# Patient Record
Sex: Female | Born: 1970 | Race: White | Hispanic: No | Marital: Married | Smoking: Never smoker
Health system: Southern US, Community
[De-identification: ages and names within clinical notes are randomized; demographics above are authoritative.]

## PROBLEM LIST (undated history)

## (undated) DIAGNOSIS — I456 Pre-excitation syndrome: Secondary | ICD-10-CM

## (undated) DIAGNOSIS — I341 Nonrheumatic mitral (valve) prolapse: Secondary | ICD-10-CM

## (undated) HISTORY — PX: OTHER SURGICAL HISTORY: SHX169

## (undated) HISTORY — DX: Pre-excitation syndrome: I45.6

## (undated) HISTORY — DX: Nonrheumatic mitral (valve) prolapse: I34.1

---

## 1998-01-14 ENCOUNTER — Other Ambulatory Visit: Admission: RE | Admit: 1998-01-14 | Discharge: 1998-01-14 | Payer: Self-pay | Admitting: *Deleted

## 1999-06-17 ENCOUNTER — Other Ambulatory Visit: Admission: RE | Admit: 1999-06-17 | Discharge: 1999-06-17 | Payer: Self-pay | Admitting: *Deleted

## 2000-01-06 ENCOUNTER — Inpatient Hospital Stay (HOSPITAL_COMMUNITY): Admission: AD | Admit: 2000-01-06 | Discharge: 2000-01-09 | Payer: Self-pay | Admitting: *Deleted

## 2000-01-06 ENCOUNTER — Encounter (INDEPENDENT_AMBULATORY_CARE_PROVIDER_SITE_OTHER): Payer: Self-pay

## 2000-02-21 ENCOUNTER — Other Ambulatory Visit: Admission: RE | Admit: 2000-02-21 | Discharge: 2000-02-21 | Payer: Self-pay | Admitting: *Deleted

## 2001-02-19 ENCOUNTER — Other Ambulatory Visit: Admission: RE | Admit: 2001-02-19 | Discharge: 2001-02-19 | Payer: Self-pay | Admitting: *Deleted

## 2002-04-03 ENCOUNTER — Other Ambulatory Visit: Admission: RE | Admit: 2002-04-03 | Discharge: 2002-04-03 | Payer: Self-pay | Admitting: *Deleted

## 2003-04-29 ENCOUNTER — Other Ambulatory Visit: Admission: RE | Admit: 2003-04-29 | Discharge: 2003-04-29 | Payer: Self-pay | Admitting: *Deleted

## 2003-07-16 ENCOUNTER — Emergency Department (HOSPITAL_COMMUNITY): Admission: EM | Admit: 2003-07-16 | Discharge: 2003-07-17 | Payer: Self-pay | Admitting: Emergency Medicine

## 2004-08-02 ENCOUNTER — Other Ambulatory Visit: Admission: RE | Admit: 2004-08-02 | Discharge: 2004-08-02 | Payer: Self-pay | Admitting: Obstetrics and Gynecology

## 2005-01-19 ENCOUNTER — Ambulatory Visit (HOSPITAL_COMMUNITY): Admission: RE | Admit: 2005-01-19 | Discharge: 2005-01-19 | Payer: Self-pay | Admitting: Obstetrics and Gynecology

## 2006-07-11 ENCOUNTER — Encounter: Admission: RE | Admit: 2006-07-11 | Discharge: 2006-07-11 | Payer: Self-pay | Admitting: Obstetrics and Gynecology

## 2006-07-11 ENCOUNTER — Encounter (INDEPENDENT_AMBULATORY_CARE_PROVIDER_SITE_OTHER): Payer: Self-pay | Admitting: Specialist

## 2006-12-29 ENCOUNTER — Encounter: Admission: RE | Admit: 2006-12-29 | Discharge: 2006-12-29 | Payer: Self-pay | Admitting: Obstetrics and Gynecology

## 2007-07-06 ENCOUNTER — Encounter: Admission: RE | Admit: 2007-07-06 | Discharge: 2007-07-06 | Payer: Self-pay | Admitting: Obstetrics and Gynecology

## 2007-12-28 ENCOUNTER — Encounter: Admission: RE | Admit: 2007-12-28 | Discharge: 2007-12-28 | Payer: Self-pay | Admitting: Obstetrics and Gynecology

## 2008-03-13 ENCOUNTER — Encounter: Admission: RE | Admit: 2008-03-13 | Discharge: 2008-03-13 | Payer: Self-pay | Admitting: Obstetrics and Gynecology

## 2010-05-14 ENCOUNTER — Encounter: Admission: RE | Admit: 2010-05-14 | Discharge: 2010-05-14 | Payer: Self-pay | Admitting: Obstetrics and Gynecology

## 2010-09-12 ENCOUNTER — Encounter: Payer: Self-pay | Admitting: Obstetrics and Gynecology

## 2011-01-07 NOTE — Op Note (Signed)
NAME:  Alison Robertson, MUN NO.:  [id number]   MEDICAL RECORD NO.:  [id number]          PATIENT TYPE:  AMB   LOCATION:  SDC                           FACILITY:  [facility]   PHYSICIAN:  Dineen Kid. Rana Snare, M.D.    DATE OF BIRTH:  05-11-1971   DATE OF PROCEDURE:  01/19/2005  DATE OF DISCHARGE:                                 OPERATIVE REPORT   PREOPERATIVE DIAGNOSIS:  Multiparity, desires sterility.   POSTOPERATIVE DIAGNOSIS:  Multiparity, desires sterility.   PROCEDURE:  Laparoscopic bilateral tubal ligation by fulguration.   SURGEON:  Dineen Kid. Rana Snare, M.D.   ANESTHESIA:  General endotracheal.   INDICATIONS:  [name] is a 40 year old G2, P2, currently with a Mirena IUD  and desires definitive surgical intervention and desires laparoscopic tubal  ligation.  The risks and benefits were discussed at length, including a five  out of 1000 failure rate.  She gives her informed consent and wishes to  proceed.   FINDINGS AT THE TIME OF SURGERY:  Normal-appearing intra-abdominal anatomy,  including normal liver, ovaries, tubes and uterus.   DESCRIPTION OF PROCEDURE:  After adequate analgesia, the patient was placed  in the dorsal lithotomy position.  She was sterilely prepped and draped.  The bladder was sterilely drained.  A tenaculum was placed on the anterior  lip of the cervix.  A 1 cm infraumbilical skin incision was made, a Veress  needle was inserted.  The abdomen was insufflated to dullness to percussion.  An 11 mm trocar was inserted and the laparoscope was inserted, and the above  findings were noted.  The right fallopian tube was identified by the  fimbriated end.  A midportion of tube was grasped with the bipolar cautery.  It was cauterized over an approximately 2-3 cm section of the tube with a  good thermal burn noted across the entirety of the tube and a loss of  resistance to the Ohm meter.  The left fallopian tube was identified by the  fimbriated end and the  midportion of the tube grasped, cauterized over a 2-3  cm section of the tube with loss of resistance by the Ohm meter and good  thermal burn noted across the entirety of the tube.  Hemostasis was achieved  at all levels.  The trocar was then removed, abdomen desufflated.  A 0  Vicryl suture was placed in an interrupted fashion in the fascia, a 3-0  Vicryl Rapide subcuticular suture was placed with good approximation and  good hemostasis.  Marcaine 0.25% 10 mL were used to infiltrate the incision.  The tenaculum was removed from the cervix, noted to be hemostatic.  The  patient was then transferred to the recovery room in stable condition.  Sponge, needle and instrument count was normal x3.  Estimated blood loss was  minimal.  The patient received 1 g of Rocephin preoperatively and 30 mg of  Toradol postoperatively.   DISPOSITION:  The patient will be discharged home and follow up in the  office in two to three weeks, sent home with an instruction sheet for  laparoscopy, also  a prescription for Vicodin #20, told to return for  increased pain, fever or bleeding.      DCL/MEDQ  D:  01/19/2005  T:  01/19/2005  Job:  [id number]

## 2011-01-07 NOTE — Op Note (Signed)
[facility] [place]  Patient:    Alison Robertson, Alison Robertson                       MRN: [id number] Proc. Date: 01/06/00 Adm. Date:  [id number] Attending:  Trevor Iha                           Operative Report  PREOPERATIVE DIAGNOSIS:       Intrauterine pregnancy at 41 weeks, previous cesarean section, and nonreassuring fetal heart rate.  POSTOPERATIVE DIAGNOSIS:      Intrauterine pregnancy at 41 weeks, previous cesarean section, and nonreassuring fetal heart rate.  OPERATION:                    Repeat low transverse cesarean section.  SURGEON:                      Trevor Iha, M.D.  ASSISTANT:  ANESTHESIA:                   Spinal.  ESTIMATED BLOOD LOSS:  INDICATIONS:                  Ms. Kuntzman is a 40 year old, gravida 2, para 1, who presented in labor at 41-1/2 weeks.  She is contracting every two to four minutes. Fetal heart rate showed several decellerations, both variable and late decellerations.  However, recovered quickly and continued to have good beat to beat.  After several hours of labor, the patient continued to have decellerations and they actually worsened, having repetitive lates at periods of time as well s occasional prolonged decellerations.  The cervix was 2 cm, 90%, and -2 with a previous cesarean section.  Because she was remote from delivery, previous cesarean section, and nonreassuring fetal heart rate, we proceeded with repeat cesarean section.  Risks and benefits were discussed.  Informed consent was obtained.  FINDINGS:                     A viable female infant, Apgars 8 and 9.  The arterial pH is currently pending.  DESCRIPTION OF PROCEDURE:     After adequate analgesia, the patient was placed n the supine position with a left lateral tilt.  She is sterilely prepped and draped. Foley catheter is sterilely placed.  A Pfannenstiel skin incision was made two fingerbreadths above the pubic symphysis, was taken down  sharply in the midline, incised transversely across the fascia which was extended superiorly and inferiorly off the bellies of the rectus muscle which were separated sharply in the midline. The peritoneum is then entered sharply.  The uterine serosal was elevated, nicked, and incised transversely, and the bladder flap was created and placed behind the bladder blade.  The low segment of the uterus was very thin.  Incision with scalpel was made to the infants vertex.  This was extended laterally with the operators  fingertips and clear fluid was noted.  The infants vertex was then delivered atraumatically.  The nares and pharynx were suctioned.  The infant was then delivered, cord clamped, and handed to the pediatrician.  Good cry was noted. Apgars were 8 and 9 of a female infant.  Cord bloods were obtained.  The placenta was extracted manually.  The uterus was exteriorized, wiped clean with a dry lap. he myotomy incision was closed with a single layer of 0 Monocryl in  a running locking fashion.  Two figure-of-eight sutures were used for hemostasis.  Normal uterus,  tubes, and ovaries were noted.  The uterus was placed back into the peritoneal cavity and after a copious amount of irrigation, adequate hemostasis was assured. The peritoneum was then closed with 0 Monocryl.  The rectus muscle was plicated in the midline.  Irrigation was applied and after adequate hemostasis, the fascia as closed with 0 Vicryl in a running fashion.  Irrigation was applied and after adequate hemostasis, the skin was stapled and Steri-Strips applied.  The patient tolerated the procedure well and was stable on transfer to the recovery room. he patient received 1 gram of cefotetan after delivery of the placenta.  Estimated  blood loss was 800 cc. DD:  01/06/00 TD:  01/08/00 Job: [id number]

## 2011-01-07 NOTE — H&P (Signed)
NAME:  Alison Robertson, Alison Robertson NO.:  [id number]   MEDICAL RECORD NO.:  [id number]          PATIENT TYPE:  AMB   LOCATION:  SDC                           FACILITY:  [facility]   PHYSICIAN:  Dineen Kid. Rana Snare, M.D.    DATE OF BIRTH:  Mar 18, 1971   DATE OF ADMISSION:  01/19/2005  DATE OF DISCHARGE:                                HISTORY & PHYSICAL   HISTORY OF PRESENT ILLNESS:  [name] is a 40 year old, G2, P2, who  presents for surgical sterilization.  Currently, she has a [place] IUD, and  is having menstrual cycles with the IUD.  She is not having any problems  with it, and it is due to be removed in September of 2007.  She does desire  definitive surgical sterilization, and presents for tubal ligation.   PAST MEDICAL HISTORY:  Negative.   PAST SURGICAL HISTORY:  She has had 2 cesarean sections.   MEDICATIONS:  She is currently on atenolol for mitral valve prolapse and  Wolff-Parkinson-White.   ALLERGIES:  She has no known drug allergies.   PHYSICAL EXAMINATION:  VITAL SIGNS:  Blood pressure 110/74, weight 179.  HEART:  Regular rate and rhythm.  LUNGS:  Clear to auscultation bilaterally.  ABDOMEN:  Nondistended, nontender.  PELVIC:  The uterus is anteverted, mobile, and nontender.  The IUD string is  in place.   IMPRESSION AND PLAN:  Multiparity and desires sterility.   PLAN:  Laparoscopic sterilization by fulguration.  Risks and benefits of the  procedure were discussed at length which include, but are not limited to,  risk of infection, bleeding, damage to the uterus, tubes, ovaries, bowel,  bladder, risk of failure of the tubal quoted at 12/998 failure.  She does  give her informed consent and wishes to proceed.      DCL/MEDQ  D:  01/18/2005  T:  01/18/2005  Job:  [id number]

## 2011-01-07 NOTE — Discharge Summary (Signed)
[facility] [place]  Patient:    Alison Robertson, Alison Robertson                       MRN: [id number] Adm. Date:  [id number] Disc. Date: [id number] Attending:  Trevor Iha Dictator:   Danie Chandler, R.N.                           Discharge Summary  ADMISSION DIAGNOSES:          1. Intrauterine pregnancy at [redacted] weeks gestation.                               2. Previous cesarean section.                               3. Nonreassuring fetal heart rate.  DISCHARGE DIAGNOSES:          1. Intrauterine pregnancy at [redacted] weeks gestation.                               2. Previous cesarean section.                               3. Nonreassuring fetal heart rate.  PROCEDURE:                    On Jan 06, 2000, repeat low transverse cesarean section.  REASON FOR ADMISSION:         The patient is a 40 year old, gravida 2, para 1, ho presented in labor at 41-1/[redacted] weeks gestation.  She was contracting every two to  four minutes.  The fetal heart rate showed several decellerations, both variable and late decellerations.  These recovered quickly and there continued to be good beat to beat variability.  After several hours of labor, the patient continued o have decellerations having repetitive lates at periods of time as well as occasional prolonged decellerations.  The cervix was 2 cm, 90%, and -2 with a previous cesarean section.  Because the patient was remote from delivery, had delivered by cesarean section previously, and a nonreassuring fetal heart rate, the decision was made to proceed with repeat cesarean section.  HOSPITAL COURSE:              The patient was taken to the operating room and underwent the above named procedure without complications.  This was productive of a viable female infant with Apgars of 8 at one minute and 9 at five minutes. Postoperatively, the patient did well.  On postoperative day #1, the patients hemoglobin was 9.0, hematocrit 25.3, and white  blood cell count 10.3.  On postoperative day #2, the patient was without complaints.  She had a good return of bowel function and was tolerating a regular diet.  She was also ambulating well  without difficulty and had good pain control.  She was discharged home on postoperative day #3.  CONDITION ON DISCHARGE:       Good.  DIET:                         Regular as tolerated.  ACTIVITY:  No heavy lifting, no driving, and no vaginal entry.  FOLLOW-UP:                    She is to follow up in the office in one to two weeks for incision check.  She is to call for a temperature greater than 100 degrees,  persistent nausea and vomiting, heavy vaginal bleeding, and/or redness or drainage from the incision site.  DISCHARGE MEDICATIONS:        1. Prenatal vitamins one p.o. q.d.                               2. Tylox as directed by M.D. DD:  01/19/00 TD:  01/19/00 Job: [id number]

## 2014-06-26 ENCOUNTER — Other Ambulatory Visit: Payer: Self-pay | Admitting: Obstetrics and Gynecology

## 2014-06-27 LAB — CYTOLOGY - PAP

## 2016-08-29 MED FILL — AZITHROMYCIN 250 MG TABLET: 250 | 5 days supply | Qty: 6 | Fill #0

## 2016-08-29 MED FILL — BENZONATATE 100 MG CAPSULE: 100 | 5 days supply | Qty: 15 | Fill #0

## 2016-08-29 MED FILL — VENTOLIN HFA 90 MCG INHALER: 108 (90 BAS | 25 days supply | Qty: 18 | Fill #0

## 2017-04-20 ENCOUNTER — Other Ambulatory Visit: Payer: Self-pay | Admitting: Obstetrics and Gynecology

## 2017-04-20 DIAGNOSIS — R928 Other abnormal and inconclusive findings on diagnostic imaging of breast: Secondary | ICD-10-CM

## 2017-04-28 ENCOUNTER — Ambulatory Visit
Admission: RE | Admit: 2017-04-28 | Discharge: 2017-04-28 | Disposition: A | Discharge status: Home / Self Care | Payer: BLUE CROSS/BLUE SHIELD | Source: Ambulatory Visit | Attending: Obstetrics and Gynecology | Admitting: Obstetrics and Gynecology

## 2017-04-28 DIAGNOSIS — R928 Other abnormal and inconclusive findings on diagnostic imaging of breast: Secondary | ICD-10-CM

## 2018-03-09 ENCOUNTER — Ambulatory Visit (HOSPITAL_COMMUNITY)
Admission: EM | Admit: 2018-03-09 | Discharge: 2018-03-09 | Disposition: A | Discharge status: Home / Self Care | Payer: BLUE CROSS/BLUE SHIELD | Attending: Internal Medicine | Admitting: Internal Medicine

## 2018-03-09 ENCOUNTER — Encounter (HOSPITAL_COMMUNITY): Payer: Self-pay

## 2018-03-09 DIAGNOSIS — Z23 Encounter for immunization: Secondary | ICD-10-CM | POA: Diagnosis not present

## 2018-03-09 DIAGNOSIS — S51811A Laceration without foreign body of right forearm, initial encounter: Secondary | ICD-10-CM | POA: Diagnosis not present

## 2018-03-09 DIAGNOSIS — W540XXA Bitten by dog, initial encounter: Secondary | ICD-10-CM | POA: Diagnosis not present

## 2018-03-09 MED ORDER — AMOXICILLIN-POT CLAVULANATE 875-125 MG PO TABS: 1.0000 | ORAL_TABLET | Freq: Two times a day (BID) | ORAL | 0 refills | Status: DC

## 2018-03-09 MED ORDER — MUPIROCIN 2 % EX OINT: 1.0000 "application " | TOPICAL_OINTMENT | Freq: Two times a day (BID) | CUTANEOUS | 0 refills | Status: DC

## 2018-03-09 MED ORDER — TETANUS-DIPHTH-ACELL PERTUSSIS 5-2.5-18.5 LF-MCG/0.5 IM SUSP
0.5000 mL | Freq: Once | INTRAMUSCULAR | Status: AC
  Administered 2018-03-09: 0.5 mL via INTRAMUSCULAR

## 2018-03-09 MED ORDER — TETANUS-DIPHTH-ACELL PERTUSSIS 5-2.5-18.5 LF-MCG/0.5 IM SUSP
INTRAMUSCULAR | Status: AC
  Filled 2018-03-09: qty 0.5

## 2018-03-09 NOTE — ED Provider Notes (Signed)
[facility]    CSN: [id number] Arrival date & time: 03/09/18  1921     History   Chief Complaint Chief Complaint  Patient presents with  . Animal Bite    HPI Sheylin MISTIE ADNEY is a 47 y.o. female.   48 year old female comes in for dog bite to the right arm.  States he was trying to feed it neighbor's dog today when she sustained the bite.  Bleeding controlled.  Denies numbness, tingling to the fingers.  Denies trouble moving the fingers.  Slight erythema to the wound.  Denies fever, chills, night sweats.  States neighbor is currently in the hospital, and neighbor's wife will be returning from out of the country tomorrow.  They think dog is updated on immunizations, but will double check tomorrow.  Unknown last tetanus.     History reviewed. No pertinent past medical history.  There are no active problems to display for this patient.   History reviewed. No pertinent surgical history.  OB History   None      Home Medications    Prior to Admission medications   Medication Sig Start Date End Date Taking? Authorizing Provider  amoxicillin-clavulanate (AUGMENTIN) 875-125 MG tablet Take 1 tablet by mouth every 12 (twelve) hours. 03/09/18   Cathie Hoops, Shakeya Kerkman V, PA-C  mupirocin ointment (BACTROBAN) 2 % Apply 1 application topically 2 (two) times daily. 03/09/18   Belinda Fisher, PA-C    Family History No family history on file.  Social History Social History   Tobacco Use  . Smoking status: Never Smoker  . Smokeless tobacco: Never Used  Substance Use Topics  . Alcohol use: Not on file  . Drug use: Not on file     Allergies   Patient has no known allergies.   Review of Systems Review of Systems  Reason unable to perform ROS: See HPI as above.     Physical Exam Triage Vital Signs ED Triage Vitals  Enc Vitals Group     BP 03/09/18 2002 130/76     Pulse Rate 03/09/18 2002 69     Resp 03/09/18 2002 18     Temp 03/09/18 2002 98.4 F (36.9 C)     Temp src --     SpO2 03/09/18 2002 100 %     Weight --      Height --      Head Circumference --      Peak Flow --      Pain Score 03/09/18 2003 5     Pain Loc --      Pain Edu? --      Excl. in GC? --    No data found.  Updated Vital Signs BP 130/76   Pulse 69   Temp 98.4 F (36.9 C)   Resp 18   SpO2 100%   Physical Exam  Constitutional: She is oriented to person, place, and time. She appears well-developed and well-nourished. No distress.  HENT:  Head: Normocephalic and atraumatic.  Eyes: Pupils are equal, round, and reactive to light. Conjunctivae are normal.  Musculoskeletal:  See picture below.  At least 3 cm laceration to the right forearm, and small puncture wound.  Bleeding controlled.  Mild tenderness to palpation around wound.  Otherwise full range of motion of elbow, wrist and fingers.  Strength normal and equal bilaterally.  Sensation intact ankle bilaterally.  Radial pulse 2+ and equal bilaterally.  Cap refill less than 2 seconds.  Neurological: She is alert and oriented to  person, place, and time.  Skin: Skin is warm and dry. She is not diaphoretic.         UC Treatments / Results  Labs (all labs ordered are listed, but only abnormal results are displayed) Labs Reviewed - No data to display  EKG None  Radiology No results found.  Procedures Procedures (including critical care time)  Medications Ordered in UC Medications  Tdap (BOOSTRIX) injection 0.5 mL (0.5 mLs Intramuscular Given 03/09/18 2110)    Initial Impression / Assessment and Plan / UC Course  I have reviewed the triage vital signs and the nursing notes.  Pertinent labs & imaging results that were available during my care of the patient were reviewed by me and considered in my medical decision making (see chart for details).    Discussed risks and benefits of laceration repair to the dog bite.  Patient opted to defer laceration repair.  Laceration cleaned extensively with pressure wash.  Cleaned  with Hibiclens.  Dressed with antibiotic ointment.  Tdap updated.  Patient start Augmentin as directed.  Wound care instructions given.  Return precautions given.  Patient expresses understanding and agrees to plan.  Final Clinical Impressions(s) / UC Diagnoses   Final diagnoses:  Dog bite, initial encounter    ED Prescriptions    Medication Sig Dispense Auth. Provider   amoxicillin-clavulanate (AUGMENTIN) 875-125 MG tablet Take 1 tablet by mouth every 12 (twelve) hours. 14 tablet Stephaie Dardis V, PA-C   mupirocin ointment (BACTROBAN) 2 % Apply 1 application topically 2 (two) times daily. 22 g Threasa AlphaYu, Kenndra Morris V, PA-C        Treshun Wold V, [place]PA-C 03/09/18 2114

## 2018-03-09 NOTE — ED Triage Notes (Signed)
Pt presents with complaints of dog bite from her neighbors dog.  Pt is pretty sure that the dog was vaccinated. She is taking care of neighbors dog and he is in the hospital. Aware we cannot do rabies vaccines here at this clinic. patient states they will find out later if it is needed.

## 2018-03-09 NOTE — Discharge Instructions (Signed)
Tetanus updated today.  Start Augmentin as directed.  As discussed, given its appointment, will not close the wound, as it can cause increased infection.  Wash area with soap and water, dressed with Bactroban ointment.  Monitor for spreading redness, increased warmth, purulent drainage, fever, increased pain, follow-up for reevaluation.  As discussed, please follow-up with neighbor for vaccinations of dog.  Please follow-up at the emergency department if dog is not up-to-date on rabies to start rabies vaccine.

## 2018-07-05 ENCOUNTER — Other Ambulatory Visit: Payer: Self-pay

## 2018-07-05 ENCOUNTER — Telehealth: Payer: Self-pay | Admitting: Cardiology

## 2018-07-05 MED ORDER — ATENOLOL 50 MG PO TABS: 50.0000 mg | ORAL_TABLET | Freq: Every day | ORAL | 0 refills | Status: DC

## 2018-07-05 NOTE — Telephone Encounter (Signed)
Gunnar Fusiaula can you tell me what pharmacy this is because I cannot find it?

## 2018-07-05 NOTE — Telephone Encounter (Signed)
Given to Revankar for review. 

## 2018-07-05 NOTE — Telephone Encounter (Signed)
° ° °  1. Which medications need to be refilled? (please list name of each medication and dose if known) Atenolol 50mg  tablet 1QD   2. Which pharmacy/location (including street and city if local pharmacy) is medication to be sent to?[facility] [phone]  3. Do they need a 30 day or 90 day supply? 90

## 2018-07-05 NOTE — Telephone Encounter (Signed)
Med refill has been sent. 

## 2018-10-05 ENCOUNTER — Other Ambulatory Visit: Payer: Self-pay | Admitting: Cardiology

## 2018-10-05 ENCOUNTER — Other Ambulatory Visit: Payer: Self-pay

## 2018-10-05 NOTE — Telephone Encounter (Signed)
6 month refill approval for atenolol sent to patients pharmacy.

## 2018-10-29 IMAGING — US ULTRASOUND LEFT BREAST LIMITED
1 series · 5 of 5 positions shown · non-contrast
Comparison: April 18, 2017 and earlier priors

CLINICAL DATA: Possible mass identified in the deep outer left
breast on recent screening mammogram.

EXAM:
2D DIGITAL DIAGNOSTIC LEFT MAMMOGRAM WITH ADJUNCT TOMO
ULTRASOUND LEFT BREAST

[Series 1: ultrasound left breast limited · 0.06mm/px · 5 of 5 slices shown]
[im 1/5]
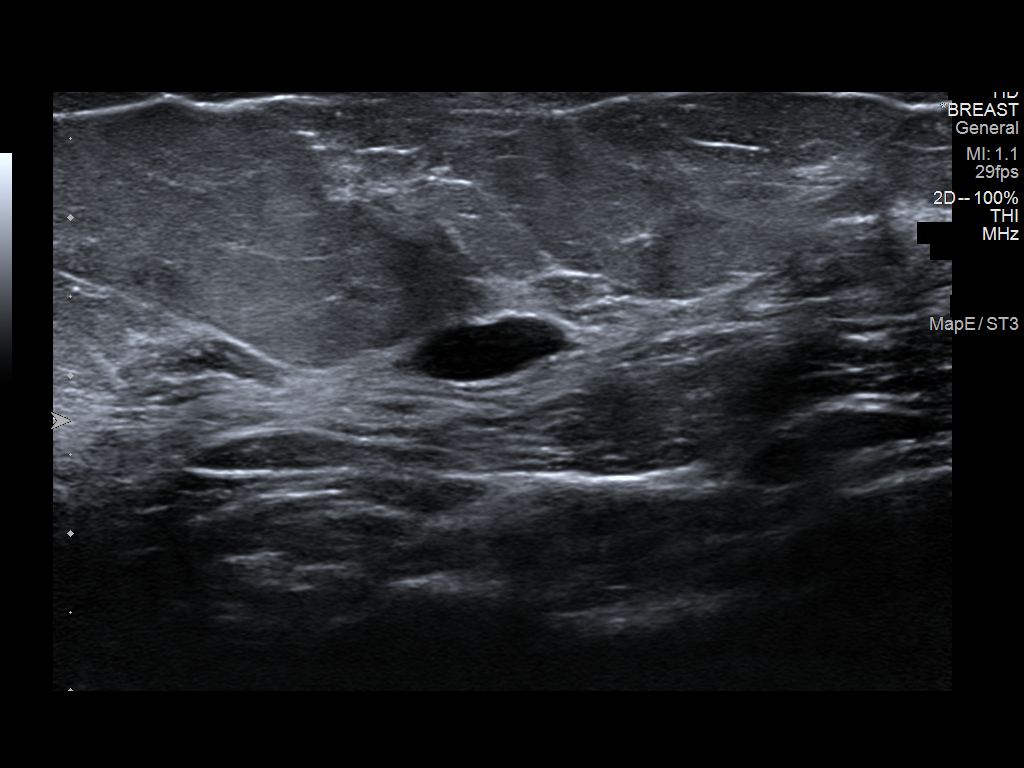
[im 2/5]
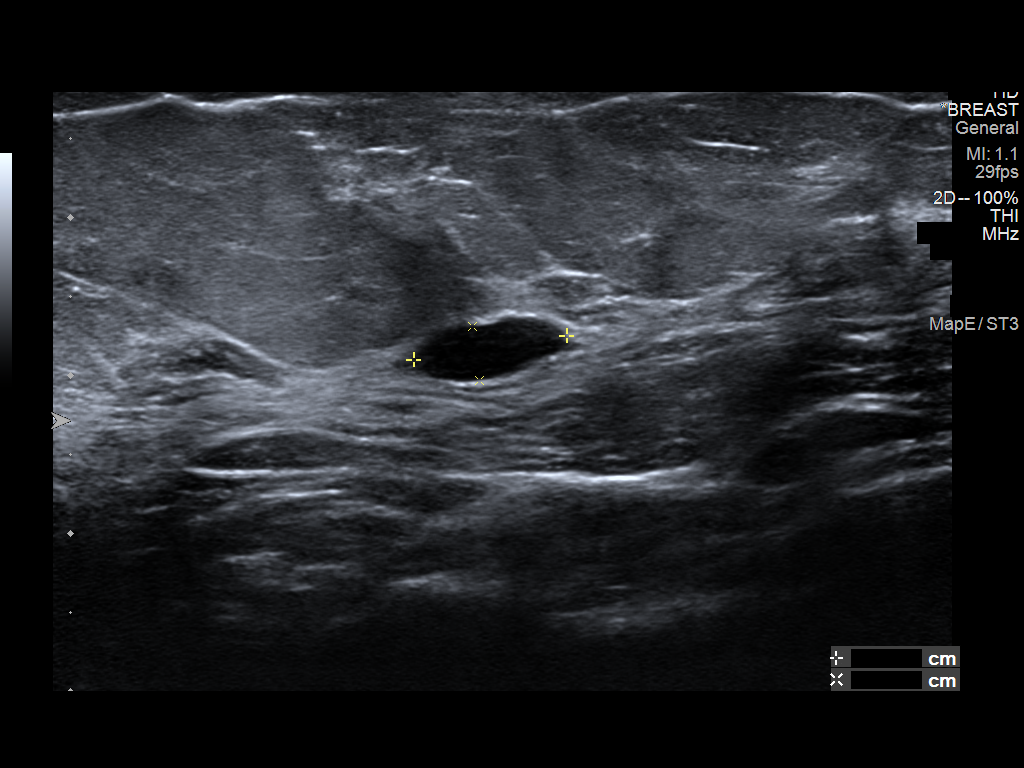
[im 3/5]
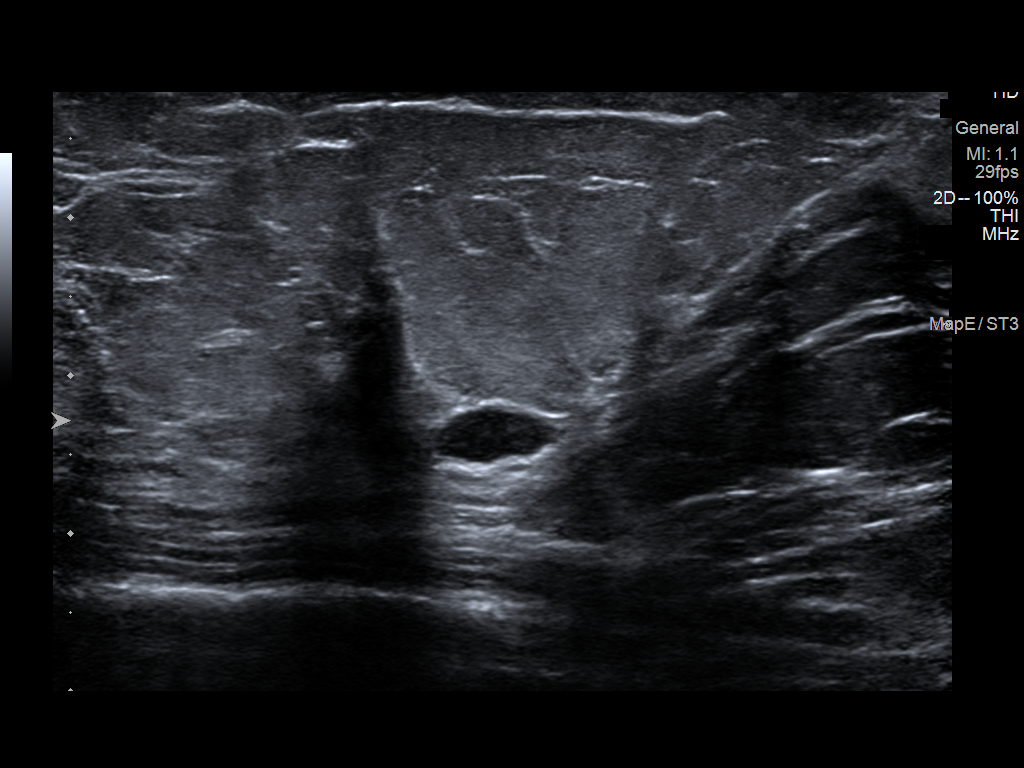
[im 4/5]
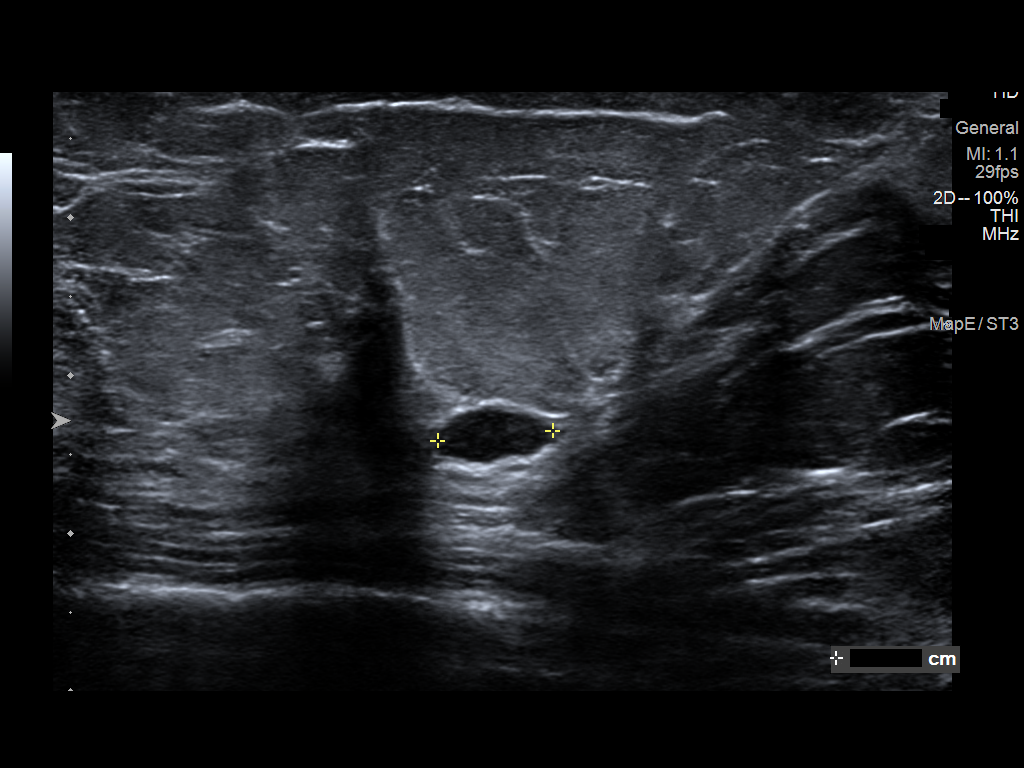
[im 5/5]
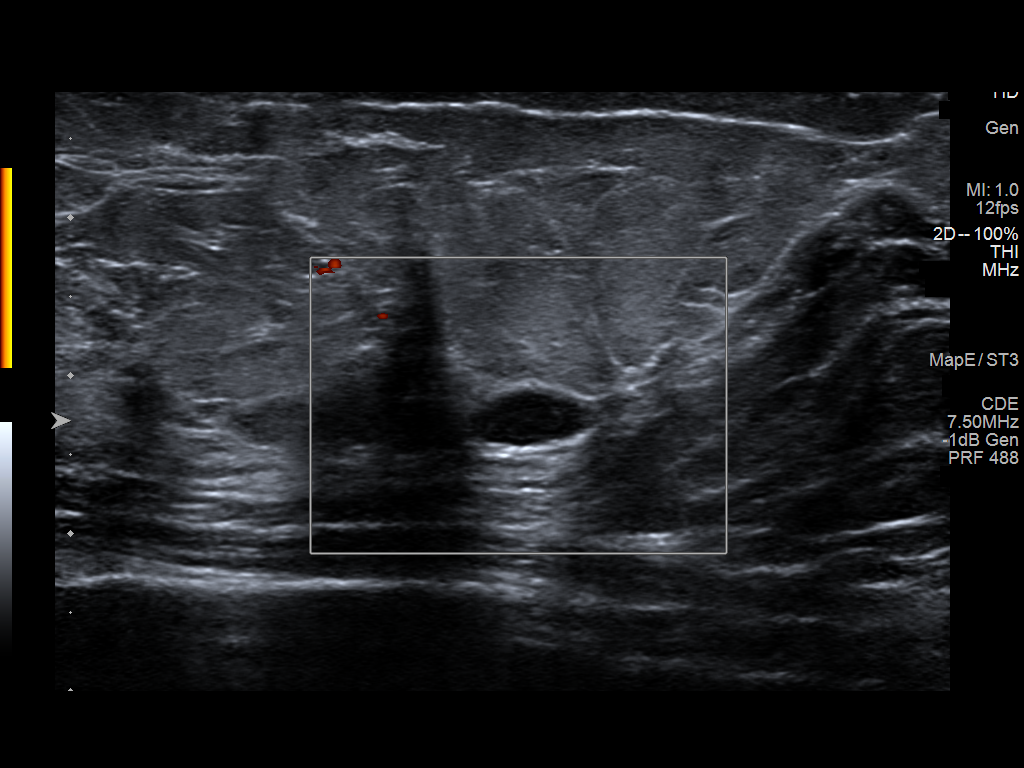

[5 of 5 positions shown; findings below may reference images not displayed]

ACR Breast Density Category c: The breast tissue is heterogeneously
dense, which may obscure small masses.
FINDINGS: Focal spot compression views of the deep outer left breast confirm a
low-density circumscribed 1 cm mass in the posterior third of the
outer left breast.

Targeted ultrasound is performed, showing a 1.0 x 0.3 x 0.7 cm
simple cyst at [DATE] position left breast 5 cm from the nipple, in
the deep aspect of the breast parenchyma.
IMPRESSION: Simple 1.0 cm left breast cyst.  No evidence of malignancy.

RECOMMENDATION:
Screening mammogram in one year.(Code:PT-Q-Q21)

I have discussed the findings and recommendations with the patient.
Results were also provided in writing at the conclusion of the
visit. If applicable, a reminder letter will be sent to the patient
regarding the next appointment.

BI-RADS CATEGORY  2: Benign.

## 2018-10-29 IMAGING — MG 2D DIGITAL DIAGNOSTIC UNILATERAL LEFT MAMMOGRAM WITH CAD AND ADJ
8 of 10 series · 9 of 22 positions shown · non-contrast
Comparison: April 18, 2017 and earlier priors

CLINICAL DATA: Possible mass identified in the deep outer left
breast on recent screening mammogram.

EXAM:
2D DIGITAL DIAGNOSTIC LEFT MAMMOGRAM WITH ADJUNCT TOMO
ULTRASOUND LEFT BREAST

[L MLO (1 of 2)]
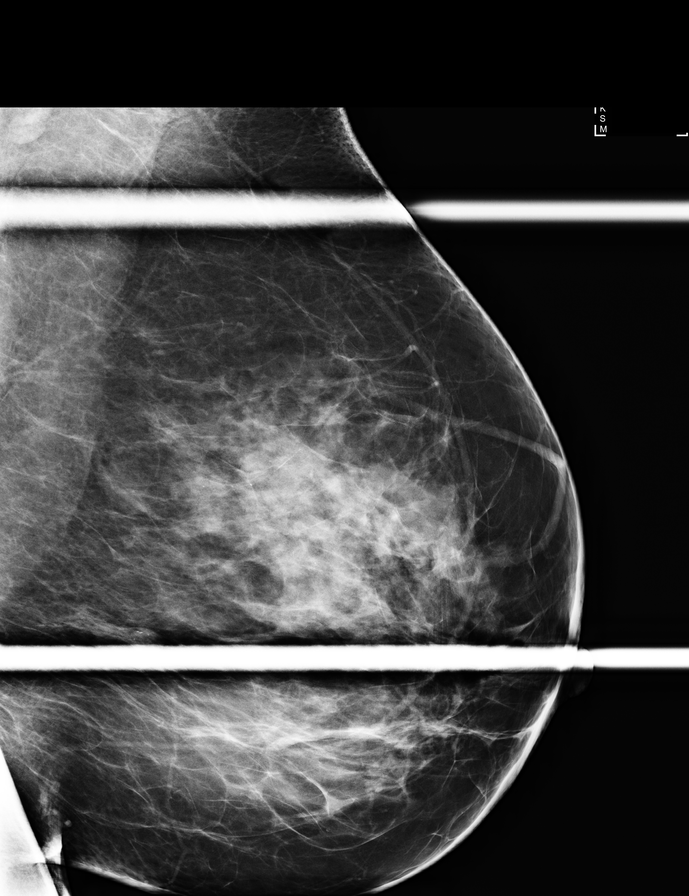

[L MLO synth-2D]
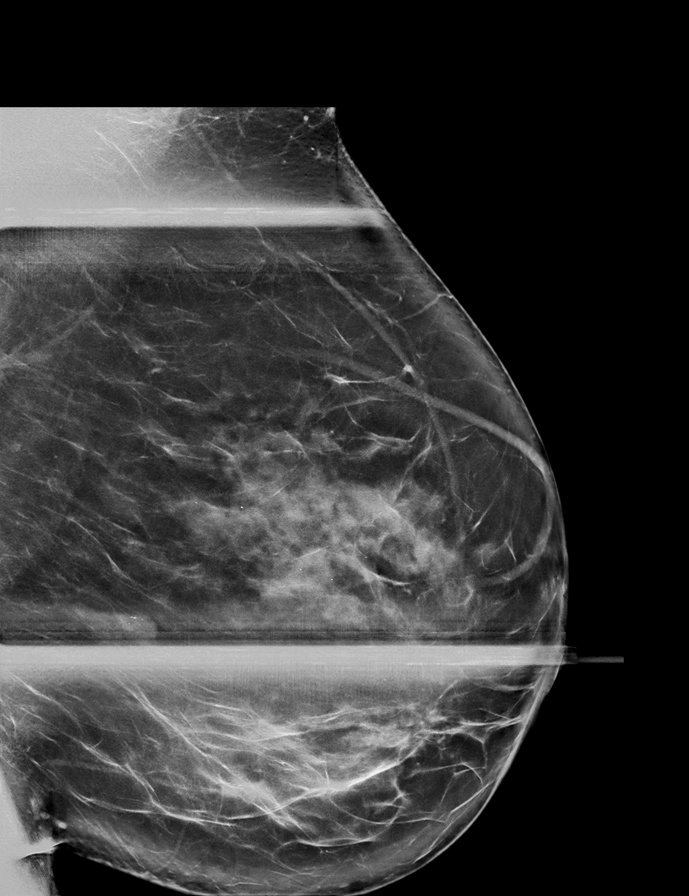

[L MLO (2 of 2)]
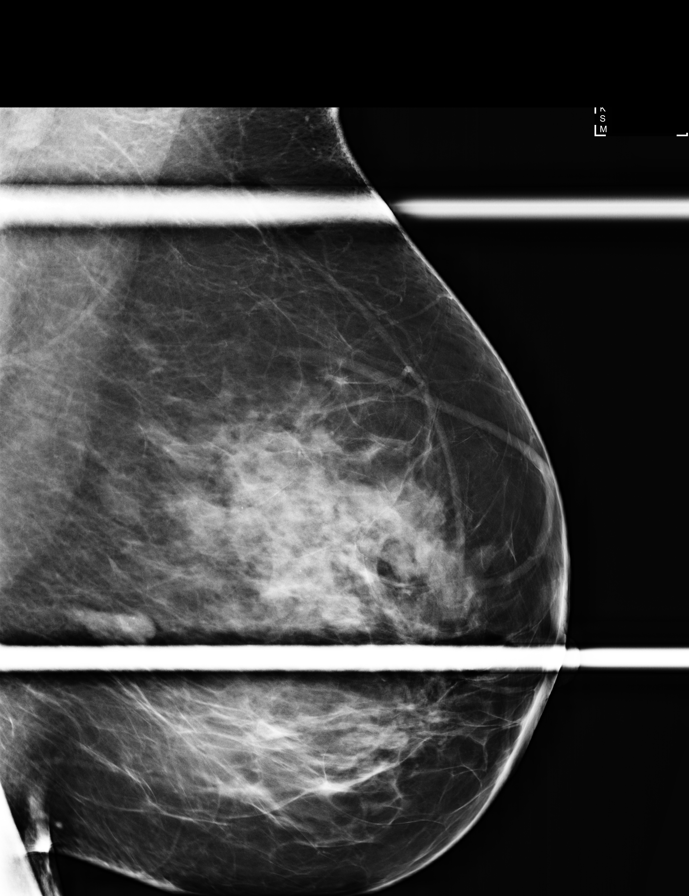

[L CC (1 of 2)]
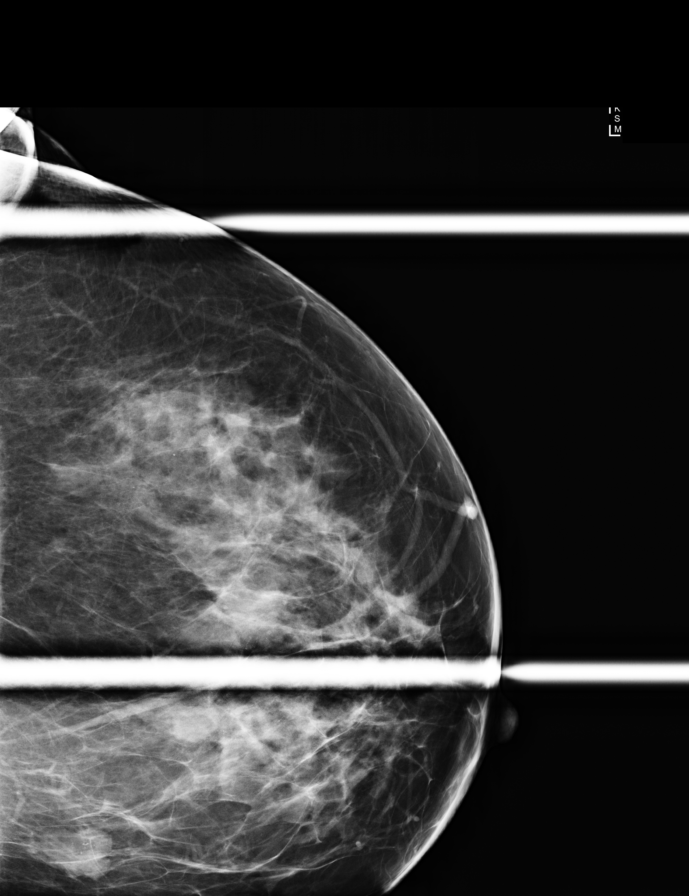

[L CC synth-2D (1 of 2)]
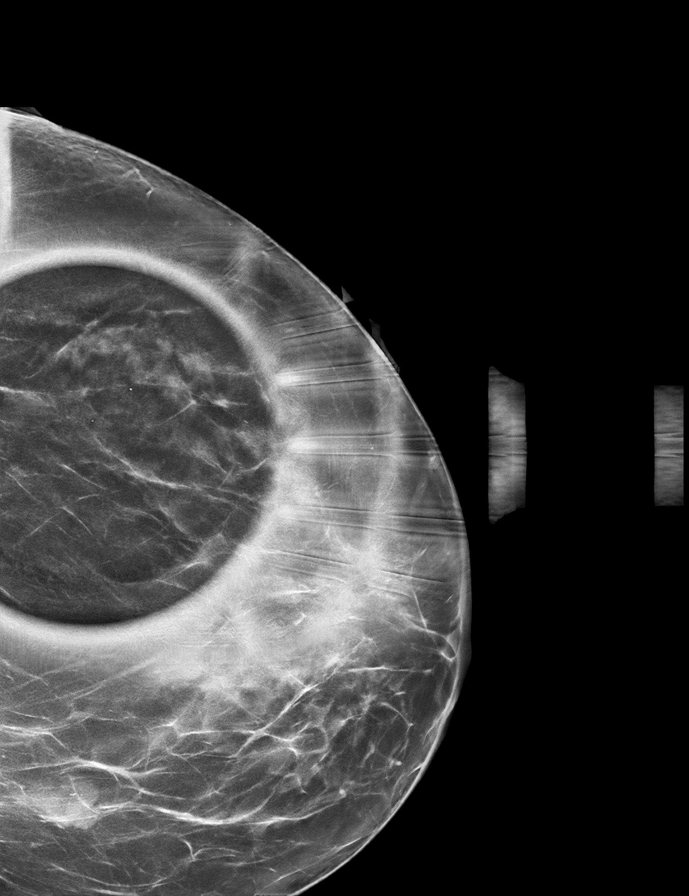

[L CC (2 of 2)]
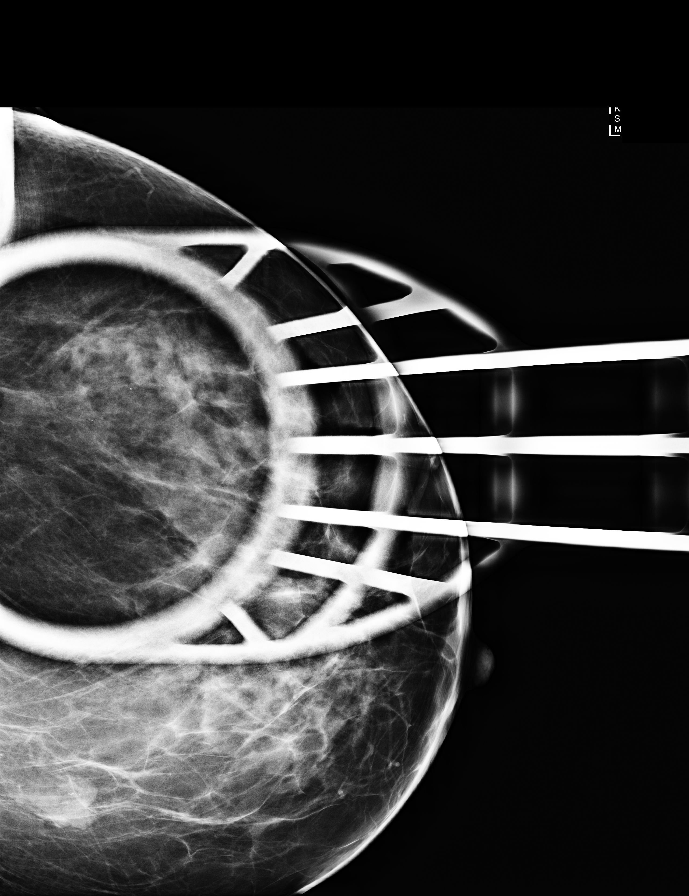

[L CC synth-2D (2 of 2)]
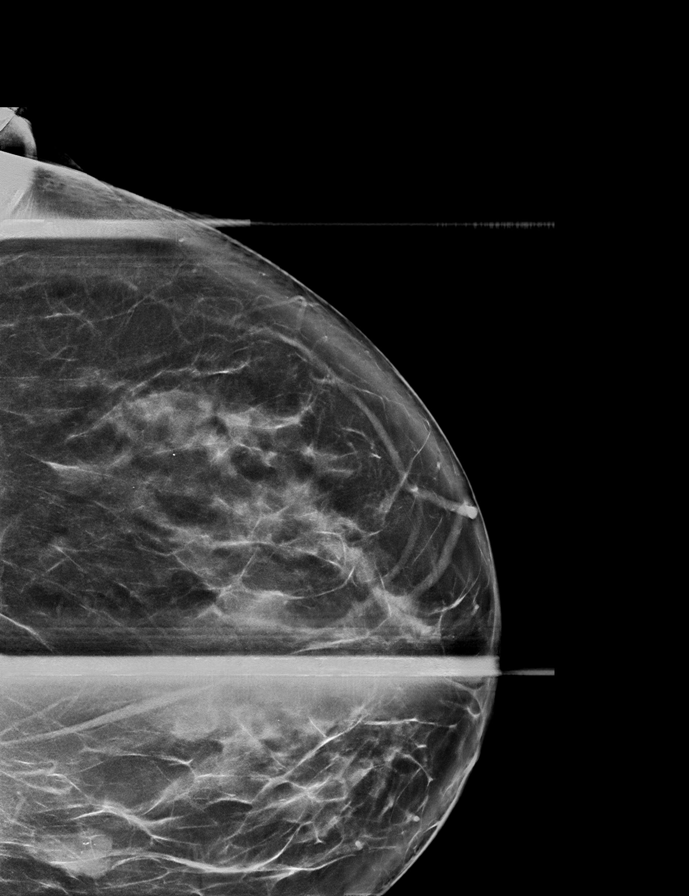

[L MLO tomo · 2 of 86 frames shown]
[frame 28/86]
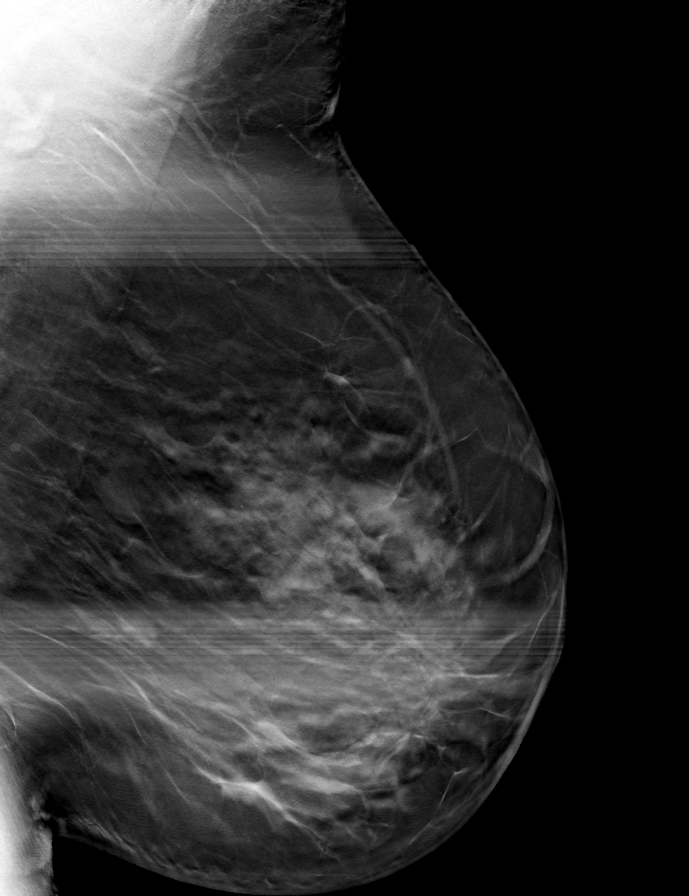
[frame 43/86]
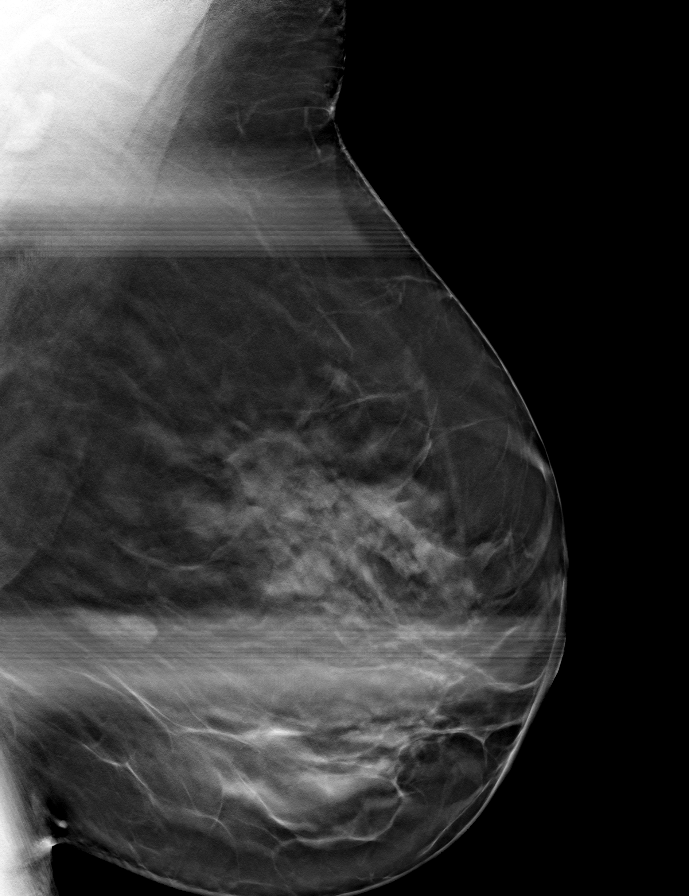

[9 of 22 positions shown; findings below may reference images not displayed]

ACR Breast Density Category c: The breast tissue is heterogeneously
dense, which may obscure small masses.
FINDINGS: Focal spot compression views of the deep outer left breast confirm a
low-density circumscribed 1 cm mass in the posterior third of the
outer left breast.

Targeted ultrasound is performed, showing a 1.0 x 0.3 x 0.7 cm
simple cyst at [DATE] position left breast 5 cm from the nipple, in
the deep aspect of the breast parenchyma.
IMPRESSION: Simple 1.0 cm left breast cyst.  No evidence of malignancy.

RECOMMENDATION:
Screening mammogram in one year.(Code:PT-Q-Q21)

I have discussed the findings and recommendations with the patient.
Results were also provided in writing at the conclusion of the
visit. If applicable, a reminder letter will be sent to the patient
regarding the next appointment.

BI-RADS CATEGORY  2: Benign.

## 2019-04-03 ENCOUNTER — Other Ambulatory Visit: Payer: Self-pay | Admitting: Cardiology

## 2019-04-25 ENCOUNTER — Other Ambulatory Visit: Payer: Self-pay | Admitting: Cardiology

## 2019-05-08 ENCOUNTER — Other Ambulatory Visit: Payer: Self-pay | Admitting: Cardiology

## 2019-05-10 ENCOUNTER — Other Ambulatory Visit: Payer: Self-pay

## 2019-05-10 ENCOUNTER — Encounter: Payer: Self-pay | Admitting: Cardiology

## 2019-05-10 ENCOUNTER — Ambulatory Visit (INDEPENDENT_AMBULATORY_CARE_PROVIDER_SITE_OTHER): Payer: BC Managed Care – PPO | Admitting: Cardiology

## 2019-05-10 VITALS — BP 133/87 | HR 75 | Temp 97.8°F | Ht 61.5 in | Wt 195.3 lb

## 2019-05-10 DIAGNOSIS — I341 Nonrheumatic mitral (valve) prolapse: Secondary | ICD-10-CM | POA: Diagnosis not present

## 2019-05-10 DIAGNOSIS — R002 Palpitations: Secondary | ICD-10-CM | POA: Diagnosis not present

## 2019-05-10 DIAGNOSIS — Z6836 Body mass index (BMI) 36.0-36.9, adult: Secondary | ICD-10-CM

## 2019-05-10 DIAGNOSIS — E6609 Other obesity due to excess calories: Secondary | ICD-10-CM

## 2019-05-10 MED ORDER — ATENOLOL 50 MG PO TABS: 50.0000 mg | ORAL_TABLET | Freq: Every day | ORAL | 3 refills | Status: DC

## 2019-05-10 NOTE — Progress Notes (Signed)
Primary Physician/Referring:  Lowe, David, MD  Patient ID: Alison Robertson, female   Alison Robertson DOB: 03/02/1971, 48 y.o.   MRN: [id number]  Chief Complaint  Patient presents with  . New Patient (Initial Visit)  . Mitral Valve Prolapse  . Wolff-Parkinson-White Syndrome   HPI:    Alison Robertson  is a 48 y.o. Caucasian female self-referred herself to see me, previously seen Dr. Karleen HampshireSpencer daily, at age 48 years of age, she was diagnosed with WPW syndrome.  She was also diagnosed with mitral valve prolapse. Over time, she was also advised that EKG had normalized.  She was started on atenolol for palpitations which patient has continued on until now.  Except for occasional palpitations that last a few seconds, she has no other specific complaints today. Requests refill of Atenolol.   Past Medical History:  Diagnosis Date  . Mitral valve prolapse   . Wolff-Parkinson-White syndrome    Past Surgical History:  Procedure Laterality Date  . c-section     1998, 2001   Social History   Socioeconomic History  . Marital status: Married    Spouse name: Not on file  . Number of children: 2  . Years of education: Not on file  . Highest education level: Not on file  Occupational History  . Not on file  Social Needs  . Financial resource strain: Not on file  . Food insecurity    Worry: Not on file    Inability: Not on file  . Transportation needs    Medical: Not on file    Non-medical: Not on file  Tobacco Use  . Smoking status: Never Smoker  . Smokeless tobacco: Never Used  Substance and Sexual Activity  . Alcohol use: Never    Frequency: Never  . Drug use: Never  . Sexual activity: Not on file  Lifestyle  . Physical activity    Days per week: Not on file    Minutes per session: Not on file  . Stress: Not on file  Relationships  . Social Musicianconnections    Talks on phone: Not on file    Gets together: Not on file    Attends religious service: Not on file    Active member of club or  organization: Not on file    Attends meetings of clubs or organizations: Not on file    Relationship status: Not on file  . Intimate partner violence    Fear of current or ex partner: Not on file    Emotionally abused: Not on file    Physically abused: Not on file    Forced sexual activity: Not on file  Other Topics Concern  . Not on file  Social History Narrative  . Not on file   ROS  Review of Systems  Constitution: Negative for chills, decreased appetite, malaise/fatigue and weight gain.  Cardiovascular: Positive for palpitations. Negative for dyspnea on exertion, leg swelling and syncope.  Endocrine: Negative for cold intolerance.  Hematologic/Lymphatic: Does not bruise/bleed easily.  Musculoskeletal: Negative for joint swelling.  Gastrointestinal: Negative for abdominal pain, anorexia, change in bowel habit, hematochezia and melena.  Neurological: Negative for headaches and light-headedness.  Psychiatric/Behavioral: Negative for depression and substance abuse.  All other systems reviewed and are negative.  Objective   Vitals with BMI 05/10/2019 03/09/2018  Height 5' 1.5" -  Weight 195 lbs 5 oz -  BMI 36.31 -  Systolic 133 130  Diastolic 87 76  Pulse 75 69    Blood  pressure 133/87, pulse 75, temperature 97.8 F (36.6 C), height 5' 1.5" (1.562 m), weight 195 lb 4.8 oz (88.6 kg), SpO2 99 %. Body mass index is 36.3 kg/m.   Physical Exam  Constitutional: She appears well-developed and well-nourished. No distress.  HENT:  Head: Atraumatic.  Eyes: Conjunctivae are normal.  Neck: Neck supple. No JVD present. No thyromegaly present.  Cardiovascular: Normal rate, regular rhythm and intact distal pulses. Exam reveals no gallop.  Murmur heard.  Crescendo-decrescendo mid to late systolic murmur is present with a grade of 2/6 at the apex. No JVD, no leg edema  Pulmonary/Chest: Effort normal and breath sounds normal.  Abdominal: Soft. Bowel sounds are normal.  Musculoskeletal:  Normal range of motion.  Neurological: She is alert.  Skin: Skin is warm and dry.  Psychiatric: She has a normal mood and affect.   Radiology: No results found.  Laboratory examination:   No results for input(s): NA, K, CL, CO2, GLUCOSE, BUN, CREATININE, CALCIUM, GFRNONAA, GFRAA in the last 8760 hours. No flowsheet data found. No flowsheet data found. Lipid Panel  No results found for: CHOL, TRIG, HDL, CHOLHDL, VLDL, LDLCALC, LDLDIRECT HEMOGLOBIN A1C No results found for: HGBA1C, MPG TSH No results for input(s): TSH in the last 8760 hours. Medications  No Known Allergies   Prior to Admission medications   Medication Sig Start Date End Date Taking? Authorizing Provider  atenolol (TENORMIN) 50 MG tablet Take 1 tablet (50 mg total) by mouth daily. Please have Dr. Einar Gip take over refills 04/25/19  Yes Revankar, Reita Cliche, MD  Ferrous Sulfate (IRON) 325 (65 Fe) MG TABS Take 1 tablet by mouth daily at 2 PM.   Yes [provider]  Multiple Vitamin (MULTIVITAMIN) capsule Take 1 capsule by mouth daily.   Yes [provider]     Current Outpatient Medications  Medication Instructions  . atenolol (TENORMIN) 50 mg, Oral, Daily, Please have Dr. Einar Gip take over refills  . Ferrous Sulfate (IRON) 325 (65 Fe) MG TABS 1 tablet, Oral, Daily  . Multiple Vitamin (MULTIVITAMIN) capsule 1 capsule, Oral, Daily    Cardiac Studies:  Event monitor 2014 rare PAC and PVC.  Echocardiogram 2015: Mild MR, LVEDP.  Assessment     ICD-10-CM   1. Mitral valve prolapse  I34.1 EKG 12-Lead    PCV ECHOCARDIOGRAM COMPLETE  2. Palpitations  R00.2 atenolol (TENORMIN) 50 MG tablet  3. Class 2 obesity due to excess calories without serious comorbidity with body mass index (BMI) of 36.0 to 36.9 in adult  E66.09    Z68.36     EKG 0/18/2020: Normal sinus rhythm at rate of 61 bpm, normal axis.  No evidence of ischemia, normal EKG.  Delta wave is not evident in the present EKG.   Recommendations:    Patient self-referred herself to see me, previously seen Dr. Frederico Hamman daily, at age 43 years of age, she was diagnosed with WPW syndrome.  She was also diagnosed with mitral valve prolapse. Over time, she was also advised that EKG had normalized.  She was started on atenolol for palpitations which patient has continued on until now.  Her EKG does not show any evidence of delta wave today.  She may have had remnant conduction tissue that is probably resolved.  She still has occasional palpitations for which she has been taking atenolol and I have refilled this.  No indication for further evaluation with regard to WPW for now until or unless she becomes symptomatic.  With regard to mitral valve  prolapse, last echo cardiac murmurs greater than 5 years ago, I do hear a click and a late systolic murmur, we'll repeat echocardiogram.  She has been taking endocarditis prophylaxis so far however if there is no significant myxomatous degeneration of the mitral valve, no endocarditis prophylaxis is indicated.  I will see her back in a year unless echocardiogram revealed significant abnormality.  She has her annual visits with Dr. Candice Robertson who does routine labs. She will need lipids as well if not done, patient states previously normal but does not last labs. Obesity is moderate BMI range and needs weight loss.    Yates Decamp, MD, [facility] 05/11/2019, 9:40 AM [facility] Pager: [phone] Office: [phone] If no answer Cell [phone]

## 2019-05-31 ENCOUNTER — Other Ambulatory Visit: Payer: Self-pay

## 2019-05-31 ENCOUNTER — Ambulatory Visit (INDEPENDENT_AMBULATORY_CARE_PROVIDER_SITE_OTHER): Payer: BC Managed Care – PPO

## 2019-05-31 DIAGNOSIS — I341 Nonrheumatic mitral (valve) prolapse: Secondary | ICD-10-CM

## 2019-06-17 ENCOUNTER — Other Ambulatory Visit: Payer: Self-pay

## 2019-06-17 DIAGNOSIS — Z20822 Contact with and (suspected) exposure to covid-19: Secondary | ICD-10-CM

## 2019-06-18 LAB — NOVEL CORONAVIRUS, NAA: SARS-CoV-2, NAA: NOT DETECTED

## 2019-06-24 ENCOUNTER — Other Ambulatory Visit: Payer: Self-pay

## 2019-06-24 DIAGNOSIS — Z20822 Contact with and (suspected) exposure to covid-19: Secondary | ICD-10-CM

## 2019-06-25 LAB — NOVEL CORONAVIRUS, NAA: SARS-CoV-2, NAA: NOT DETECTED

## 2020-05-08 ENCOUNTER — Other Ambulatory Visit: Payer: Self-pay

## 2020-05-08 ENCOUNTER — Ambulatory Visit: Payer: BC Managed Care – PPO | Admitting: Cardiology

## 2020-05-08 ENCOUNTER — Encounter: Payer: Self-pay | Admitting: Cardiology

## 2020-05-08 VITALS — BP 134/80 | HR 66 | Resp 16 | Ht 61.0 in | Wt 207.0 lb

## 2020-05-08 DIAGNOSIS — E6609 Other obesity due to excess calories: Secondary | ICD-10-CM

## 2020-05-08 DIAGNOSIS — I340 Nonrheumatic mitral (valve) insufficiency: Secondary | ICD-10-CM

## 2020-05-08 DIAGNOSIS — R002 Palpitations: Secondary | ICD-10-CM

## 2020-05-08 DIAGNOSIS — I341 Nonrheumatic mitral (valve) prolapse: Secondary | ICD-10-CM

## 2020-05-08 DIAGNOSIS — I10 Essential (primary) hypertension: Secondary | ICD-10-CM

## 2020-05-08 MED ORDER — VALSARTAN 80 MG PO TABS: 80.0000 mg | ORAL_TABLET | Freq: Every day | ORAL | 2 refills | Status: DC

## 2020-05-08 MED ORDER — ATENOLOL 50 MG PO TABS: 50.0000 mg | ORAL_TABLET | Freq: Every day | ORAL | 3 refills | Status: DC

## 2020-05-08 NOTE — Progress Notes (Signed)
Primary Physician/Referring:  Louretta Shorten, MD  Patient ID: Alison Robertson, female    DOB: 09-25-1970, 49 y.o.   MRN: [id number]  Chief Complaint  Patient presents with  . Mitral Regurgitation    F/U MVP   HPI:    Alison Robertson  is a 49 y.o. Caucasian female at age 49 years of age, she was diagnosed with WPW syndrome.  She was also diagnosed with mitral valve prolapse. Over time, she was also advised that EKG had normalized.  She was started on atenolol for palpitations which patient has continued on until now.   Patient presents for 1 year follow-up on mitral valve prolapse.  At last visit refilled patient's atenolol which she has been taking for palpitations and repeated an echo as is had been 5 years since her last.  No DM. Except for occasional palpitations, she remains asymptomatic.   Past Medical History:  Diagnosis Date  . Mitral valve prolapse   . Wolff-Parkinson-White syndrome    Past Surgical History:  Procedure Laterality Date  . c-section     1998, 2001   Social History   Tobacco Use  . Smoking status: Never Smoker  . Smokeless tobacco: Never Used  Substance Use Topics  . Alcohol use: Never   Marital Status: Married  ROS  Review of Systems  Cardiovascular: Positive for palpitations. Negative for chest pain, dyspnea on exertion and leg swelling.  Gastrointestinal: Negative for melena.   Objective   Vitals with BMI 05/08/2020 05/08/2020 05/10/2019  Height - $Remove'5\' 1"'rxmExth$  5' 1.5"  Weight - 207 lbs 195 lbs 5 oz  BMI - 28.36 62.94  Systolic 765 465 035  Diastolic 80 82 87  Pulse - 66 75    Blood pressure 134/80, pulse 66, resp. rate 16, height $RemoveBe'5\' 1"'FTipdfxzc$  (1.549 m), weight 207 lb (93.9 kg), SpO2 96 %. Body mass index is 39.11 kg/m.   Physical Exam Constitutional:      Comments: Morbidly obese in no acute distress.  Cardiovascular:     Rate and Rhythm: Normal rate and regular rhythm.     Pulses: Normal pulses and intact distal pulses.          Carotid pulses are 2+  on the right side and 2+ on the left side.      Dorsalis pedis pulses are 2+ on the right side and 2+ on the left side.       Posterior tibial pulses are 2+ on the right side and 2+ on the left side.     Heart sounds: Murmur heard.  Crescendo-decrescendo mid to late systolic murmur is present with a grade of 2/6 at the apex.  No gallop.      Comments: No leg edema. No JVD Pulmonary:     Effort: Pulmonary effort is normal.     Breath sounds: Normal breath sounds.  Abdominal:     General: Bowel sounds are normal.     Palpations: Abdomen is soft.     Comments: Obese. Pannus present    Radiology: No results found.  Laboratory examination:   No results for input(s): NA, K, CL, CO2, GLUCOSE, BUN, CREATININE, CALCIUM, GFRNONAA, GFRAA in the last 8760 hours. No flowsheet data found. No flowsheet data found. Lipid Panel  No results found for: CHOL, TRIG, HDL, CHOLHDL, VLDL, LDLCALC, LDLDIRECT HEMOGLOBIN A1C No results found for: HGBA1C, MPG TSH No results for input(s): TSH in the last 8760 hours.   Medications  No Known Allergies  Current Outpatient  Medications on File Prior to Visit  Medication Sig Dispense Refill  . Ferrous Sulfate (IRON) 325 (65 Fe) MG TABS Take 1 tablet by mouth daily at 2 PM.    . Multiple Vitamin (MULTIVITAMIN) capsule Take 1 capsule by mouth daily.     No current facility-administered medications on file prior to visit.    Cardiac Studies:  Event monitor 2014 rare PAC and PVC.  Echocardiogram 2015: Mild MR, LVEDP.  Echocardiogram 05/31/2019:  Normal LV systolic function with EF 55%. Left ventricle cavity is normal in size. Normal global wall motion. Calculated EF 55%.  Moderate (Grade II) mitral regurgitation. Mild mitral valve prolapse with symmetric leaflets.  Compared to 02/16/2011, obvious mitral prolapse no longer present but otherwise no significant change.  EKG:   EKG 05/08/2020: Normal sinus rhythm with rate 58 bpm, normal axis, no evidence of  ischemia, normal EKG.   No significant change from EKG 05/08/2020.  Assessment     ICD-10-CM   1. Mitral valve prolapse  I34.1 EKG 12-Lead  2. Moderate mitral regurgitation  I34.0   3. Primary hypertension  I10 CMP14+EGFR    Lipid Panel With LDL/HDL Ratio    valsartan (DIOVAN) 80 MG tablet  4. Class 2 obesity due to excess calories without serious comorbidity with body mass index (BMI) of 39.0 to 39.9 in adult  E66.09    Z68.39   5. Palpitations  R00.2 atenolol (TENORMIN) 50 MG tablet    Meds ordered this encounter  Medications  . valsartan (DIOVAN) 80 MG tablet    Sig: Take 1 tablet (80 mg total) by mouth daily.    Dispense:  30 tablet    Refill:  2  . atenolol (TENORMIN) 50 MG tablet    Sig: Take 1 tablet (50 mg total) by mouth daily.    Dispense:  90 tablet    Refill:  3    Medications Discontinued During This Encounter  Medication Reason  . atenolol (TENORMIN) 50 MG tablet Reorder     Recommendations:   Alison Robertson  is a  49 y.o. Caucasian female at age 49 years of age, she was diagnosed with WPW syndrome.  She was also diagnosed with mitral valve prolapse. Over time, she was also advised that EKG had normalized.  She was started on atenolol for palpitations which patient has continued on until now.   Patient presents for 1 year follow-up on mitral valve prolapse.  Physical examination there is no significant change in mitral regurgitation murmur.  I will consider repeating her echocardiogram in a year in view of moderate MR.  Her blood pressure is elevated today, I will start her on valsartan 80 mg, she will continue to monitor and goal blood pressure was 125/75 mmHg.  She works in a Environmental education officer.  Obesity, nearly morbid discussed with the patient and weight loss discussed.  Suspect weight loss will certainly help her to improve her blood pressure and she may be able to come off of valsartan.  She has not had a lipid profile testing in a while, labs were ordered.  I  will also obtain a CMP 2 weeks after starting valsartan.  Otherwise stable from cardiac standpoint, no endocarditis prophylaxis indicated.  I will see her back in a year.   Adrian Prows, MD, [facility] 05/08/2020, 3:11 PM Office: [phone]

## 2020-05-15 ENCOUNTER — Ambulatory Visit: Payer: Self-pay | Admitting: Cardiology

## 2020-05-26 LAB — LIPID PANEL WITH LDL/HDL RATIO
Cholesterol, Total: 200 mg/dL — ABNORMAL HIGH (ref 100–199)
HDL: 48 mg/dL (ref >39)
LDL Chol Calc (NIH): 142 mg/dL — ABNORMAL HIGH (ref 0–99)
LDL/HDL Ratio: 3 ratio (ref 0.0–3.2)
Triglycerides: 55 mg/dL (ref 0–149)
VLDL Cholesterol Cal: 10 mg/dL (ref 5–40)

## 2020-05-26 LAB — CMP14+EGFR
ALT: 23 IU/L (ref 0–32)
AST: 17 IU/L (ref 0–40)
Albumin/Globulin Ratio: 1.4 (ref 1.2–2.2)
Albumin: 4 g/dL (ref 3.8–4.8)
Alkaline Phosphatase: 106 IU/L (ref 44–121)
BUN/Creatinine Ratio: 15 (ref 9–23)
BUN: 13 mg/dL (ref 6–24)
Bilirubin Total: 0.3 mg/dL (ref 0.0–1.2)
CO2: 24 mmol/L (ref 20–29)
Calcium: 9.4 mg/dL (ref 8.7–10.2)
Chloride: 107 mmol/L — ABNORMAL HIGH (ref 96–106)
Creatinine, Ser: 0.87 mg/dL (ref 0.57–1.00)
GFR calc Af Amer: 90 mL/min/{1.73_m2} (ref >59)
GFR calc non Af Amer: 78 mL/min/{1.73_m2} (ref >59)
Globulin, Total: 2.8 g/dL (ref 1.5–4.5)
Glucose: 97 mg/dL (ref 65–99)
Potassium: 4.4 mmol/L (ref 3.5–5.2)
Sodium: 144 mmol/L (ref 134–144)
Total Protein: 6.8 g/dL (ref 6.0–8.5)

## 2020-08-01 ENCOUNTER — Other Ambulatory Visit: Payer: Self-pay | Admitting: Cardiology

## 2020-08-01 DIAGNOSIS — I10 Essential (primary) hypertension: Secondary | ICD-10-CM

## 2020-08-22 HISTORY — PX: COLONOSCOPY, ESOPHAGOGASTRODUODENOSCOPY (EGD) AND ESOPHAGEAL DILATION: SHX5781

## 2021-01-26 ENCOUNTER — Other Ambulatory Visit: Payer: Self-pay | Admitting: Cardiology

## 2021-01-26 DIAGNOSIS — I10 Essential (primary) hypertension: Secondary | ICD-10-CM

## 2021-05-03 ENCOUNTER — Other Ambulatory Visit: Payer: Self-pay | Admitting: Cardiology

## 2021-05-03 DIAGNOSIS — R002 Palpitations: Secondary | ICD-10-CM

## 2021-05-14 ENCOUNTER — Encounter: Payer: Self-pay | Admitting: Cardiology

## 2021-05-14 ENCOUNTER — Ambulatory Visit: Payer: BC Managed Care – PPO | Admitting: Cardiology

## 2021-05-14 ENCOUNTER — Other Ambulatory Visit: Payer: Self-pay

## 2021-05-14 VITALS — BP 115/75 | HR 74 | Temp 98.2°F | Resp 17 | Ht 61.0 in | Wt 215.4 lb

## 2021-05-14 DIAGNOSIS — I341 Nonrheumatic mitral (valve) prolapse: Secondary | ICD-10-CM

## 2021-05-14 DIAGNOSIS — E7800 Pure hypercholesterolemia, unspecified: Secondary | ICD-10-CM

## 2021-05-14 DIAGNOSIS — I10 Essential (primary) hypertension: Secondary | ICD-10-CM

## 2021-05-14 DIAGNOSIS — I340 Nonrheumatic mitral (valve) insufficiency: Secondary | ICD-10-CM

## 2021-05-14 MED ORDER — ATORVASTATIN CALCIUM 10 MG PO TABS: 10.0000 mg | ORAL_TABLET | Freq: Every day | ORAL | 3 refills | Status: DC

## 2021-05-14 NOTE — Patient Instructions (Signed)
Start stain therapy  Labs in fasting state at [facility] in 3 months for repeat lipid testing.

## 2021-05-14 NOTE — Progress Notes (Signed)
Primary Physician/Referring:  Candice Camp, MD  Patient ID: Alison Robertson, female    DOB: 1970/10/15, 50 y.o.   MRN: [id number]  Chief Complaint  Patient presents with   moderate mitral regurgitation   Hypertension   Follow-up    1 year   HPI:    Alison Robertson  is a 50 y.o. Caucasian female at age 83 years of age, she was diagnosed with WPW syndrome.  She was also diagnosed with mitral valve prolapse. Over time, she was also advised that EKG had normalized.  She was started on atenolol for palpitations which patient has continued on until now.   Patient presents for 1 year follow up.  At last office visit patient's blood pressure was elevated, therefore started her on valsartan 80 mg daily, repeat BMP remained stable.  Performed lipid profile testing following last visit, lipids were uncontrolled and patient was advised to follow-up with PCP for further management however she did not follow-up regarding this.  Patient is asymptomatic from a cardiovascular standpoint.  She walks 15 to 20 minutes 2 times daily with her dog without issue.  Unfortunately since last office visit patient has gained weight, stating that she is helping take care of her parents which is making it difficult to make diet and lifestyle changes.  Denies chest pain, dyspnea, syncope, near syncope.  Denies palpitations, dizziness, orthopnea, leg swelling.  She is tolerating valsartan without issue.  Past Medical History:  Diagnosis Date   Mitral valve prolapse    Wolff-Parkinson-White syndrome    Family History  Problem Relation Age of Onset   Healthy Mother    Heart disease Father    Hypertension Father    Neuropathy Father    Kidney disease Father    Transient ischemic attack Father 26   Past Surgical History:  Procedure Laterality Date   c-section     1998, 2001   COLONOSCOPY, ESOPHAGOGASTRODUODENOSCOPY (EGD) AND ESOPHAGEAL DILATION  2022   Social History   Tobacco Use   Smoking status: Never    Smokeless tobacco: Never  Substance Use Topics   Alcohol use: Never   Marital Status: Married  ROS  Review of Systems  Constitutional: Negative for malaise/fatigue and weight gain.  Cardiovascular:  Negative for chest pain, claudication, dyspnea on exertion, leg swelling, near-syncope, orthopnea, palpitations (improved), paroxysmal nocturnal dyspnea and syncope.  Respiratory:  Negative for shortness of breath.   Gastrointestinal:  Negative for melena.  Neurological:  Negative for dizziness.  Objective   Vitals with BMI 05/14/2021 05/08/2020 05/08/2020  Height 5\' 1"  - 5\' 1"   Weight 215 lbs 6 oz - 207 lbs  BMI 40.72 - 39.13  Systolic 115 134  Diastolic 75 80 82  Pulse 74 - 66    Blood pressure 115/75, pulse 74, temperature 98.2 F (36.8 C), temperature source Temporal, resp. rate 17, height 5\' 1"  (1.549 m), weight 215 lb 6.4 oz (97.7 kg), SpO2 99 %. Body mass index is 40.7 kg/m.   Physical Exam Vitals reviewed.  Constitutional:      Comments: Morbidly obese in no acute distress.  Cardiovascular:     Rate and Rhythm: Normal rate and regular rhythm.     Pulses: Normal pulses and intact distal pulses.          Carotid pulses are 2+ on the right side and 2+ on the left side.      Dorsalis pedis pulses are 2+ on the right side and 2+ on the left side.  Posterior tibial pulses are 2+ on the right side and 2+ on the left side.     Heart sounds: Murmur heard.  Crescendo-decrescendo mid to late systolic murmur is present with a grade of 2/6 at the apex.    No gallop.     Comments: No JVD Pulmonary:     Effort: Pulmonary effort is normal.     Breath sounds: Normal breath sounds.  Abdominal:     General: Bowel sounds are normal.     Palpations: Abdomen is soft.     Comments: Obese. Pannus present  Musculoskeletal:     Right lower leg: No edema.     Left lower leg: No edema.   Radiology: No results found.  Laboratory examination:   Recent Labs    05/25/20 0813  NA  144  K 4.4  CL 107*  CO2 24  GLUCOSE 97  BUN 13  CREATININE 0.87  CALCIUM 9.4  GFRNONAA 78  GFRAA 90   CMP Latest Ref Rng & Units 05/25/2020  Glucose 65 - 99 mg/dL 97  BUN 6 - 24 mg/dL 13  Creatinine 3.55 - 7.32 mg/dL 2.02  Sodium 542 - 706 mmol/L 144  Potassium 3.5 - 5.2 mmol/L 4.4  Chloride 96 - 106 mmol/L 107(H)  CO2 20 - 29 mmol/L 24  Calcium 8.7 - 10.2 mg/dL 9.4  Total Protein 6.0 - 8.5 g/dL 6.8  Total Bilirubin 0.0 - 1.2 mg/dL 0.3  Alkaline Phos 44 - 121 IU/L 106  AST 0 - 40 IU/L 17  ALT 0 - 32 IU/L 23   No flowsheet data found. Lipid Panel     Component Value Date/Time   CHOL 200 (H) 05/25/2020 0813   TRIG 55 05/25/2020 0813   HDL 48 05/25/2020 0813   LDLCALC 142 (H) 05/25/2020 0813   HEMOGLOBIN A1C No results found for: HGBA1C, MPG TSH No results for input(s): TSH in the last 8760 hours.  Allergies  No Known Allergies    Medications Prior to Visit:   Outpatient Medications Prior to Visit  Medication Sig Dispense Refill   atenolol (TENORMIN) 50 MG tablet TAKE 1 TABLET BY MOUTH EVERY DAY 90 tablet 0   fexofenadine (ALLEGRA) 180 MG tablet Take 180 mg by mouth daily.     Multiple Vitamin (MULTIVITAMIN) capsule Take 1 capsule by mouth daily.     Omega-3 1000 MG CAPS Take 1 tablet by mouth daily.     valsartan (DIOVAN) 80 MG tablet TAKE 1 TABLET BY MOUTH EVERY DAY 90 tablet 1   famotidine (PEPCID) 40 MG tablet Take 1 tablet by mouth at bedtime. (Patient not taking: Reported on 05/14/2021)     Ferrous Sulfate (IRON) 325 (65 Fe) MG TABS Take 1 tablet by mouth daily at 2 PM.     No facility-administered medications prior to visit.     Final Medications at End of Visit    Current Meds  Medication Sig   atenolol (TENORMIN) 50 MG tablet TAKE 1 TABLET BY MOUTH EVERY DAY   atorvastatin (LIPITOR) 10 MG tablet Take 1 tablet (10 mg total) by mouth daily.   fexofenadine (ALLEGRA) 180 MG tablet Take 180 mg by mouth daily.   Multiple Vitamin (MULTIVITAMIN) capsule  Take 1 capsule by mouth daily.   Omega-3 1000 MG CAPS Take 1 tablet by mouth daily.   valsartan (DIOVAN) 80 MG tablet TAKE 1 TABLET BY MOUTH EVERY DAY    Cardiac Studies:  Event monitor 2014 rare PAC and PVC.  Echocardiogram  2015: Mild MR, LVEDP.  Echocardiogram 05/31/2019:  Normal LV systolic function with EF 55%. Left ventricle cavity is normal in size. Normal global wall motion. Calculated EF 55%.  Moderate (Grade II) mitral regurgitation. Mild mitral valve prolapse with symmetric leaflets.  Compared to 02/16/2011, obvious mitral prolapse no longer present but otherwise no significant change.  EKG:  05/14/2021: Sinus rhythm at a rate of 66 bpm.  Normal axis.  Nonspecific T wave abnormality.  05/08/2020: Normal sinus rhythm with rate 58 bpm, normal axis, no evidence of ischemia, normal EKG.   No significant change from EKG 05/08/2020.  Assessment     ICD-10-CM   1. Primary hypertension  I10 EKG 12-Lead    2. Hypercholesterolemia  E78.00 Lipid Panel With LDL/HDL Ratio    Lipid Panel With LDL/HDL Ratio    3. Moderate mitral regurgitation  I34.0 PCV ECHOCARDIOGRAM COMPLETE    4. Mitral valve prolapse  I34.1 PCV ECHOCARDIOGRAM COMPLETE      Meds ordered this encounter  Medications   atorvastatin (LIPITOR) 10 MG tablet    Sig: Take 1 tablet (10 mg total) by mouth daily.    Dispense:  30 tablet    Refill:  3    Medications Discontinued During This Encounter  Medication Reason   Ferrous Sulfate (IRON) 325 (65 Fe) MG TABS Error     Recommendations:   Alison Robertson  is a  50 y.o. Caucasian female at age 8 years of age, she was diagnosed with WPW syndrome.  She was also diagnosed with mitral valve prolapse. Over time, she was also advised that EKG had normalized.  She was started on atenolol for palpitations which patient has continued on until now.   Patient presents for 1 year follow up.  At last office visit patient's blood pressure was elevated, therefore started her on  valsartan 80 mg daily, repeat BMP remained stable.  Patient's blood pressure is now well controlled.  She remains asymptomatic and palpitations are well controlled with atenolol.  Patient's lipids are uncontrolled following last office visit, shared decision was to proceed with initiation of low-dose statin therapy as patient reports it will be difficult for her to make diet and lifestyle modifications.  We will repeat lipid profile testing in 3 months.  Given history of moderate MR and mitral valve prolapse will obtain repeat echocardiogram at this time.  Again discussed with patient regarding the importance of diet and lifestyle changes particularly weight loss.  Patient is otherwise stable from a cardiovascular standpoint, EKG and physical exam remained stable.  No indication for endocarditis prophylaxis.  Follow-up in 1 year, sooner if needed, for MR/MVP, hyperlipidemia, hypertension.   Rayford Halsted, PA-C 05/14/2021, 2:17 PM Office: [phone]

## 2021-06-04 ENCOUNTER — Ambulatory Visit: Payer: BC Managed Care – PPO

## 2021-06-04 ENCOUNTER — Other Ambulatory Visit: Payer: Self-pay

## 2021-06-04 DIAGNOSIS — I340 Nonrheumatic mitral (valve) insufficiency: Secondary | ICD-10-CM

## 2021-06-04 DIAGNOSIS — I341 Nonrheumatic mitral (valve) prolapse: Secondary | ICD-10-CM

## 2021-07-30 ENCOUNTER — Other Ambulatory Visit: Payer: Self-pay | Admitting: Cardiology

## 2021-07-30 DIAGNOSIS — I10 Essential (primary) hypertension: Secondary | ICD-10-CM

## 2021-07-30 DIAGNOSIS — R002 Palpitations: Secondary | ICD-10-CM

## 2021-08-12 ENCOUNTER — Other Ambulatory Visit: Payer: Self-pay | Admitting: Student

## 2021-08-23 ENCOUNTER — Encounter: Payer: Self-pay | Admitting: Cardiology

## 2021-08-23 DIAGNOSIS — E7800 Pure hypercholesterolemia, unspecified: Secondary | ICD-10-CM

## 2021-08-24 MED ORDER — SIMVASTATIN 40 MG PO TABS: 40.0000 mg | ORAL_TABLET | Freq: Every day | ORAL | 2 refills | Status: DC

## 2021-08-24 NOTE — Telephone Encounter (Signed)
From pt

## 2021-08-24 NOTE — Telephone Encounter (Signed)
ICD-10-CM   1. Hypercholesterolemia  E78.00 simvastatin (ZOCOR) 40 MG tablet     Meds ordered this encounter  Medications   simvastatin (ZOCOR) 40 MG tablet    Sig: Take 1 tablet (40 mg total) by mouth at bedtime.    Dispense:  30 tablet    Refill:  2    Medications Discontinued During This Encounter  Medication Reason   atorvastatin (LIPITOR) 10 MG tablet Side effect (s)     Yates Decamp, MD, [facility] 08/24/2021, 10:03 PM Office: [phone] Fax: [phone] Pager: [phone]

## 2021-10-29 ENCOUNTER — Other Ambulatory Visit: Payer: Self-pay | Admitting: Cardiology

## 2021-10-29 DIAGNOSIS — R002 Palpitations: Secondary | ICD-10-CM

## 2021-12-03 ENCOUNTER — Other Ambulatory Visit: Payer: Self-pay | Admitting: Cardiology

## 2021-12-03 DIAGNOSIS — E7800 Pure hypercholesterolemia, unspecified: Secondary | ICD-10-CM

## 2022-02-01 ENCOUNTER — Other Ambulatory Visit: Payer: Self-pay | Admitting: Student

## 2022-02-01 ENCOUNTER — Other Ambulatory Visit: Payer: Self-pay | Admitting: Cardiology

## 2022-02-01 DIAGNOSIS — I10 Essential (primary) hypertension: Secondary | ICD-10-CM

## 2022-02-01 DIAGNOSIS — R002 Palpitations: Secondary | ICD-10-CM

## 2022-02-10 ENCOUNTER — Other Ambulatory Visit: Payer: Self-pay | Admitting: Otolaryngology

## 2022-02-10 DIAGNOSIS — H9072 Mixed conductive and sensorineural hearing loss, unilateral, left ear, with unrestricted hearing on the contralateral side: Secondary | ICD-10-CM

## 2022-02-10 DIAGNOSIS — H7292 Unspecified perforation of tympanic membrane, left ear: Secondary | ICD-10-CM

## 2022-02-18 ENCOUNTER — Ambulatory Visit
Admission: RE | Admit: 2022-02-18 | Discharge: 2022-02-18 | Disposition: A | Discharge status: Home / Self Care | Payer: BC Managed Care – PPO | Source: Ambulatory Visit | Attending: Otolaryngology | Admitting: Otolaryngology

## 2022-02-18 DIAGNOSIS — H9072 Mixed conductive and sensorineural hearing loss, unilateral, left ear, with unrestricted hearing on the contralateral side: Secondary | ICD-10-CM

## 2022-02-18 DIAGNOSIS — H7292 Unspecified perforation of tympanic membrane, left ear: Secondary | ICD-10-CM

## 2022-03-03 ENCOUNTER — Other Ambulatory Visit: Payer: Self-pay | Admitting: Cardiology

## 2022-03-03 DIAGNOSIS — E7800 Pure hypercholesterolemia, unspecified: Secondary | ICD-10-CM

## 2022-05-07 LAB — LIPID PANEL WITH LDL/HDL RATIO
Cholesterol, Total: 165 mg/dL (ref 100–199)
HDL: 48 mg/dL (ref >39)
LDL Chol Calc (NIH): 103 mg/dL — ABNORMAL HIGH (ref 0–99)
LDL/HDL Ratio: 2.1 ratio (ref 0.0–3.2)
Triglycerides: 74 mg/dL (ref 0–149)
VLDL Cholesterol Cal: 14 mg/dL (ref 5–40)

## 2022-05-12 NOTE — Progress Notes (Signed)
Primary Physician/Referring:  Louretta Shorten, MD  Patient ID: Alison Robertson, female    DOB: [birth year], 51 y.o.   MRN: [id number]  No chief complaint on file.  HPI:    JATARA HEVIA  is a 51 y.o. Caucasian female with history of WPW syndrome that was diagnosed at age 51, mitral valve prolapse, hypertension, and hyperlipidemia. In context of WPW syndrome, overtime she was advised that EKG had normalized.  Patient presents for 1 year follow up. Patient is asymptomatic from a cardiovascular standpoint.  She walks 20-30 minutes 2 times daily with her dog without issue. She has not lost any weight but is continuing to work on diet and plans to increase exercise.  Denies chest pain, dyspnea, syncope, near syncope.  Denies palpitations, dizziness, orthopnea, leg swelling.  She is tolerating valsartan without issue.  Past Medical History:  Diagnosis Date   Mitral valve prolapse    Wolff-Parkinson-White syndrome    Family History  Problem Relation Age of Onset   Healthy Mother    Heart disease Father    Hypertension Father    Neuropathy Father    Kidney disease Father    Transient ischemic attack Father 58   Past Surgical History:  Procedure Laterality Date   c-section     1998, 2001   COLONOSCOPY, ESOPHAGOGASTRODUODENOSCOPY (EGD) AND ESOPHAGEAL DILATION  2022   Social History   Tobacco Use   Smoking status: Never   Smokeless tobacco: Never  Substance Use Topics   Alcohol use: Never   Marital Status: Married  ROS  Review of Systems  Constitutional: Negative for malaise/fatigue and weight gain.  Cardiovascular:  Negative for chest pain, claudication, dyspnea on exertion, leg swelling, near-syncope, orthopnea, palpitations (improved), paroxysmal nocturnal dyspnea and syncope.  Respiratory:  Negative for shortness of breath.   Gastrointestinal:  Negative for melena.  Neurological:  Negative for dizziness.   Objective      05/13/2022    1:35 PM 05/14/2021    1:30 PM  05/08/2020    2:52 PM  Vitals with BMI  Height 5\' 1"  5\' 1"    Weight 215 lbs 215 lbs 6 oz   BMI 0000000 0000000   Systolic 0000000 [id number]  Diastolic 73 75 80  Pulse 70 74     Blood pressure 128/73, pulse 70, temperature 98 F (36.7 C), temperature source Temporal, resp. rate 16, height 5\' 1"  (1.549 m), weight 215 lb (97.5 kg), SpO2 98 %. Body mass index is 40.62 kg/m.   Physical Exam Vitals reviewed.  Cardiovascular:     Rate and Rhythm: Normal rate and regular rhythm.     Pulses: Normal pulses and intact distal pulses.          Carotid pulses are 2+ on the right side and 2+ on the left side.      Radial pulses are 2+ on the right side and 2+ on the left side.       Dorsalis pedis pulses are 2+ on the right side and 2+ on the left side.       Posterior tibial pulses are 2+ on the right side and 2+ on the left side.     Heart sounds: A midsystolic click.     No gallop.     Comments: No JVD Pulmonary:     Effort: Pulmonary effort is normal.     Breath sounds: Normal breath sounds.  Abdominal:     General: Bowel sounds are normal.     Palpations:  Abdomen is soft.  Musculoskeletal:     Right lower leg: No edema.     Left lower leg: No edema.  Neurological:     Mental Status: She is alert.    Radiology: No results found.  Laboratory examination:   No results for input(s): "NA", "K", "CL", "CO2", "GLUCOSE", "BUN", "CREATININE", "CALCIUM", "GFRNONAA", "GFRAA" in the last 8760 hours.     Latest Ref Rng & Units 05/25/2020    8:13 AM  CMP  Glucose 65 - 99 mg/dL 97   BUN 6 - 24 mg/dL 13   Creatinine 0.57 - 1.00 mg/dL 0.87   Sodium 134 - 144 mmol/L 144   Potassium 3.5 - 5.2 mmol/L 4.4   Chloride 96 - 106 mmol/L 107   CO2 20 - 29 mmol/L 24   Calcium 8.7 - 10.2 mg/dL 9.4   Total Protein 6.0 - 8.5 g/dL 6.8   Total Bilirubin 0.0 - 1.2 mg/dL 0.3   Alkaline Phos 44 - 121 IU/L 106   AST 0 - 40 IU/L 17   ALT 0 - 32 IU/L 23        No data to display         Lipid Panel      Component Value Date/Time   CHOL 165 05/06/2022 0709   TRIG 74 05/06/2022 0709   HDL 48 05/06/2022 0709   LDLCALC 103 (H) 05/06/2022 0709   HEMOGLOBIN A1C No results found for: "HGBA1C", "MPG" TSH No results for input(s): "TSH" in the last 8760 hours.  Allergies   Allergies  Allergen Reactions   Lipitor [Atorvastatin] Other (See Comments)    Arthralgia     Medications Prior to Visit:   Outpatient Medications Prior to Visit  Medication Sig Dispense Refill   fexofenadine (ALLEGRA) 180 MG tablet Take 180 mg by mouth daily.     Multiple Vitamin (MULTIVITAMIN) capsule Take 1 capsule by mouth daily.     Omega-3 1000 MG CAPS Take 1 tablet by mouth daily.     atenolol (TENORMIN) 50 MG tablet TAKE 1 TABLET BY MOUTH EVERY DAY 90 tablet 0   simvastatin (ZOCOR) 40 MG tablet TAKE 1 TABLET BY MOUTH EVERYDAY AT BEDTIME 90 tablet 0   valsartan (DIOVAN) 80 MG tablet TAKE 1 TABLET BY MOUTH EVERY DAY 90 tablet 1   famotidine (PEPCID) 40 MG tablet Take 1 tablet by mouth at bedtime. (Patient not taking: Reported on 05/14/2021)     No facility-administered medications prior to visit.   Final Medications at End of Visit    Current Meds  Medication Sig   fexofenadine (ALLEGRA) 180 MG tablet Take 180 mg by mouth daily.   Multiple Vitamin (MULTIVITAMIN) capsule Take 1 capsule by mouth daily.   Omega-3 1000 MG CAPS Take 1 tablet by mouth daily.   [DISCONTINUED] atenolol (TENORMIN) 50 MG tablet TAKE 1 TABLET BY MOUTH EVERY DAY   [DISCONTINUED] simvastatin (ZOCOR) 40 MG tablet TAKE 1 TABLET BY MOUTH EVERYDAY AT BEDTIME   [DISCONTINUED] valsartan (DIOVAN) 80 MG tablet TAKE 1 TABLET BY MOUTH EVERY DAY    Cardiac Studies:   Echocardiogram 06/04/2021: Left ventricle cavity is normal in size and wall thickness. Normal global wall motion. Normal LV systolic function with EF 63%. Normal diastolic filling pattern.  Moderate (Grade II) mitral regurgitation. Mild to moderate tricuspid regurgitation.  Estimated pulmonary artery systolic pressure 32 mmHg.  No significant change compared to previous study on 05/31/2019.  Event monitor 2014 rare PAC and PVC.  EKG:  05/13/22: Sinus rhythm at a rate of 68 bpm. Normal axis. Incomplete right bundle branch block. Nonspecific T wave abnormality.  Assessment     ICD-10-CM   1. Primary hypertension  I10 EKG 12-Lead    valsartan (DIOVAN) 80 MG tablet    2. Hypercholesterolemia  E78.00 simvastatin (ZOCOR) 40 MG tablet    3. Moderate mitral regurgitation  I34.0     4. Mitral valve prolapse  I34.1     5. Palpitations  R00.2 atenolol (TENORMIN) 50 MG tablet      Meds ordered this encounter  Medications   atenolol (TENORMIN) 50 MG tablet    Sig: Take 1 tablet (50 mg total) by mouth daily.    Dispense:  90 tablet    Refill:  3    Order Specific Question:   Supervising Provider    Answer:   Adrian Prows [[id number]]   simvastatin (ZOCOR) 40 MG tablet    Sig: TAKE 1 TABLET BY MOUTH EVERYDAY AT BEDTIME    Dispense:  90 tablet    Refill:  3    Order Specific Question:   Supervising Provider    Answer:   Adrian Prows [[id number]]   valsartan (DIOVAN) 80 MG tablet    Sig: Take 1 tablet (80 mg total) by mouth daily.    Dispense:  90 tablet    Refill:  3    Order Specific Question:   Supervising Provider    Answer:   Adrian Prows [[id number]]    Medications Discontinued During This Encounter  Medication Reason   valsartan (DIOVAN) 80 MG tablet Reorder   atenolol (TENORMIN) 50 MG tablet Reorder   simvastatin (ZOCOR) 40 MG tablet Reorder    Recommendations:   Tametha SANDA MAKINSON  is a  51 y.o. Caucasian female with history of WPW syndrome that was diagnosed at age 46, mitral valve prolapse, and hyperlipidemia. In context of WPW syndrome, overtime she was advised that EKG had normalized.  Discussed with patient regarding the importance of diet and lifestyle changes particularly weight loss. Last echo showed no significant changes from previous echo. Plan to repeat  echo next year. Continue current medications without changes.  Physical exam without significant findings. Unable to appreciate murmur but can auscultate midsystolic click. We discussed the importance of diet and having an exercise regimen.  Patient is otherwise stable from a cardiovascular standpoint, EKG and physical exam remained stable. No indication for endocarditis prophylaxis.  Follow-up in 1 year, sooner if needed, for MR/MVP, hyperlipidemia, hypertension.   Ernst Spell, NP 05/13/2022, 1:55 PM Office: [phone]

## 2022-05-13 ENCOUNTER — Ambulatory Visit: Payer: BC Managed Care – PPO

## 2022-05-13 ENCOUNTER — Ambulatory Visit: Payer: BC Managed Care – PPO | Admitting: Student

## 2022-05-13 VITALS — BP 128/73 | HR 70 | Temp 98.0°F | Resp 16 | Ht 61.0 in | Wt 215.0 lb

## 2022-05-13 DIAGNOSIS — I341 Nonrheumatic mitral (valve) prolapse: Secondary | ICD-10-CM

## 2022-05-13 DIAGNOSIS — E7800 Pure hypercholesterolemia, unspecified: Secondary | ICD-10-CM

## 2022-05-13 DIAGNOSIS — R002 Palpitations: Secondary | ICD-10-CM

## 2022-05-13 DIAGNOSIS — I10 Essential (primary) hypertension: Secondary | ICD-10-CM | POA: Insufficient documentation

## 2022-05-13 DIAGNOSIS — I340 Nonrheumatic mitral (valve) insufficiency: Secondary | ICD-10-CM

## 2022-05-13 MED ORDER — ATENOLOL 50 MG PO TABS
50.0000 mg | ORAL_TABLET | Freq: Every day | ORAL | 3 refills | Status: DC
Start: 2022-05-13 — End: 2023-05-09

## 2022-05-13 MED ORDER — VALSARTAN 80 MG PO TABS: 80.0000 mg | ORAL_TABLET | Freq: Every day | ORAL | 3 refills | Status: DC

## 2022-05-13 MED ORDER — SIMVASTATIN 40 MG PO TABS: ORAL_TABLET | ORAL | 3 refills | Status: DC

## 2022-06-10 ENCOUNTER — Other Ambulatory Visit: Payer: Managed Care, Other (non HMO)

## 2022-06-17 ENCOUNTER — Other Ambulatory Visit: Payer: Managed Care, Other (non HMO)

## 2022-06-24 ENCOUNTER — Ambulatory Visit: Payer: Managed Care, Other (non HMO)

## 2022-06-24 DIAGNOSIS — I340 Nonrheumatic mitral (valve) insufficiency: Secondary | ICD-10-CM

## 2022-06-24 DIAGNOSIS — I341 Nonrheumatic mitral (valve) prolapse: Secondary | ICD-10-CM

## 2022-07-01 NOTE — Progress Notes (Signed)
Lvm for patient.

## 2022-07-31 ENCOUNTER — Ambulatory Visit: Payer: Self-pay

## 2023-05-06 ENCOUNTER — Other Ambulatory Visit: Payer: Self-pay

## 2023-05-06 DIAGNOSIS — R002 Palpitations: Secondary | ICD-10-CM

## 2023-06-07 ENCOUNTER — Ambulatory Visit: Payer: Self-pay | Admitting: Cardiology

## 2023-06-08 ENCOUNTER — Other Ambulatory Visit: Payer: Self-pay

## 2023-06-08 DIAGNOSIS — I10 Essential (primary) hypertension: Secondary | ICD-10-CM

## 2023-06-08 DIAGNOSIS — E7800 Pure hypercholesterolemia, unspecified: Secondary | ICD-10-CM

## 2023-06-15 ENCOUNTER — Telehealth: Payer: Self-pay | Admitting: Cardiology

## 2023-06-15 DIAGNOSIS — E7800 Pure hypercholesterolemia, unspecified: Secondary | ICD-10-CM

## 2023-06-15 DIAGNOSIS — I10 Essential (primary) hypertension: Secondary | ICD-10-CM

## 2023-06-15 MED ORDER — VALSARTAN 80 MG PO TABS: 80.0000 mg | ORAL_TABLET | Freq: Every day | ORAL | 0 refills | Status: DC

## 2023-06-15 MED ORDER — SIMVASTATIN 40 MG PO TABS: ORAL_TABLET | ORAL | 0 refills | Status: DC

## 2023-06-15 NOTE — Telephone Encounter (Signed)
 *  STAT* If patient is at the pharmacy, call can be transferred to refill team.   1. Which medications need to be refilled? (please list name of each medication and dose if known)  simvastatin (ZOCOR) 40 MG tablet   valsartan (DIOVAN) 80 MG tablet   2. Would you like to learn more about the convenience, safety, & potential cost savings by using the [facility]?    3. Are you open to using the [facility] (Type [facility].    4. Which pharmacy/location (including street and city if local pharmacy) is medication to be sent to? [facility] - [place], [place] - [address]    5. Do they need a 30 day or 90 day supply? 30 days

## 2023-07-29 ENCOUNTER — Other Ambulatory Visit: Payer: Self-pay | Admitting: Cardiology

## 2023-07-29 DIAGNOSIS — R002 Palpitations: Secondary | ICD-10-CM

## 2023-09-18 ENCOUNTER — Other Ambulatory Visit: Payer: Self-pay | Admitting: Cardiology

## 2023-09-18 DIAGNOSIS — I10 Essential (primary) hypertension: Secondary | ICD-10-CM

## 2023-09-18 DIAGNOSIS — E7800 Pure hypercholesterolemia, unspecified: Secondary | ICD-10-CM

## 2023-09-19 ENCOUNTER — Encounter: Payer: Self-pay | Admitting: Cardiology

## 2023-09-19 ENCOUNTER — Ambulatory Visit: Payer: 59 | Attending: Cardiology | Admitting: Cardiology

## 2023-09-19 VITALS — BP 135/72 | HR 65 | Resp 16 | Ht 61.0 in | Wt 208.8 lb

## 2023-09-19 DIAGNOSIS — I10 Essential (primary) hypertension: Secondary | ICD-10-CM

## 2023-09-19 DIAGNOSIS — E7800 Pure hypercholesterolemia, unspecified: Secondary | ICD-10-CM

## 2023-09-19 DIAGNOSIS — I341 Nonrheumatic mitral (valve) prolapse: Secondary | ICD-10-CM

## 2023-09-19 NOTE — Progress Notes (Signed)
Cardiology Office Note:  .   Date:  09/19/2023  ID:  Alison Robertson, DOB Dec 24, 1970, MRN [id number] PCP: Candice Camp, MD  [facility] Providers Cardiologist:  Yates Decamp, MD   History of Present Illness: .   Alison Robertson is a 53 y.o. Caucasian female patient with diagnosis of WPW syndrome at age 38, delta wave seen to have normalized over time, mitral valve prolapse, chronic palpitations and hypertension presents for annual visit.  Discussed the use of AI scribe software for clinical note transcription with the patient, who gave verbal consent to proceed.  History of Present Illness   The patient presents for a routine cardiology follow-up.  The patient has been stable with no new concerns. She reports no chest pain, shortness of breath, or palpitations. She has not seen her family doctor recently and has not had any recent blood work done.  She is currently taking simvastatin for cholesterol management. Blood pressure is well controlled with atenolol 50 mg and valsartan. She has a history of mild mitral valve prolapse, which has been stable for several years and does not require antibiotics before dental procedures.  Cholesterol levels have not been checked in the past year and a half, with the last LDL recorded at 103. She plans to follow up with her gynecologist for lab work, including cholesterol levels.  She is considering weight loss options due to upcoming weddings in August and November, expressing a desire to improve health and lose weight. She does not consume alcohol, and her body mass index is noted to be high at 39.  Since moving to [place], she has been less active due to fewer places to walk compared to her previous residence. She currently walks her dog in the yard but does not engage in much other physical activity.       Labs   Lab Results  Component Value Date   CHOL 165 05/06/2022   HDL 48 05/06/2022   LDLCALC 103 (H) 05/06/2022   TRIG 74  05/06/2022   Lab Results  Component Value Date   NA 144 05/25/2020   K 4.4 05/25/2020   CO2 24 05/25/2020   GLUCOSE 97 05/25/2020   BUN 13 05/25/2020   CREATININE 0.87 05/25/2020   CALCIUM 9.4 05/25/2020   GFRNONAA 78 05/25/2020      Latest Ref Rng & Units 05/25/2020    8:13 AM  BMP  Glucose 65 - 99 mg/dL 97   BUN 6 - 24 mg/dL 13   Creatinine 7.82 - 1.00 mg/dL 9.56   BUN/Creat Ratio 9 - 23 15   Sodium 134 - 144 mmol/L 144   Potassium 3.5 - 5.2 mmol/L 4.4   Chloride 96 - 106 mmol/L 107   CO2 20 - 29 mmol/L 24   Calcium 8.7 - 10.2 mg/dL 9.4    Review of Systems  Cardiovascular:  Negative for chest pain, dyspnea on exertion and leg swelling.    Physical Exam:   VS:  BP 135/72 (BP Location: Left Arm, Patient Position: Sitting, Cuff Size: Normal)   Pulse 65   Resp 16   Ht 5\' 1"  (1.549 m)   Wt 208 lb 12.8 oz (94.7 kg)   SpO2 97%   BMI 39.45 kg/m    Wt Readings from Last 3 Encounters:  09/19/23 208 lb 12.8 oz (94.7 kg)  05/13/22 215 lb (97.5 kg)  05/14/21 215 lb 6.4 oz (97.7 kg)     Physical Exam Neck:  Vascular: No JVD.  Cardiovascular:     Rate and Rhythm: Normal rate and regular rhythm.     Pulses: Intact distal pulses.     Heart sounds: S1 normal and S2 normal. Murmur heard.     Mid to late systolic murmur is present with a grade of 2/6 at the apex.     No gallop.  Pulmonary:     Effort: Pulmonary effort is normal.     Breath sounds: Normal breath sounds.  Abdominal:     General: Bowel sounds are normal.     Palpations: Abdomen is soft.  Musculoskeletal:     Right lower leg: No edema.     Left lower leg: No edema.    Studies Reviewed: .    Echocardiogram 06/24/2022: Left ventricle cavity is normal in size and wall thickness. Normal global wall motion. Normal LV systolic function with EF 62%. Normal diastolic filling pattern. Left atrial cavity is mildly dilated. Mild (Grade I) mitral regurgitation. Mild tricuspid regurgitation. Estimated pulmonary  artery systolic pressure 24 mmHg. MR and TR previously reported mod and mil-mod respectively in 06/04/2021.  EKG:    EKG Interpretation Date/Time:  Tuesday September 19 2023 15:44:27 EST Ventricular Rate:  66 PR Interval:  160 QRS Duration:  82 QT Interval:  416 QTC Calculation: 436 R Axis:   9  Text Interpretation: EKG 09/19/2023: Normal sinus rhythm at rate of 66 bpm, borderline criteria for LVH in aVL.  Nonspecific T abnormality.  No significant change from 05/13/2022. Confirmed by Delrae Rend [phone]) on 09/19/2023 4:03:02 PM    Medications and allergies    Allergies  Allergen Reactions   Lipitor [Atorvastatin] Other (See Comments)    Arthralgia    Current Outpatient Medications:    atenolol (TENORMIN) 50 MG tablet, TAKE 1 TABLET BY MOUTH EVERY DAY, Disp: 90 tablet, Rfl: 0   famotidine (PEPCID) 40 MG tablet, Take 1 tablet by mouth at bedtime., Disp: , Rfl:    fexofenadine (ALLEGRA) 180 MG tablet, Take 180 mg by mouth daily., Disp: , Rfl:    Multiple Vitamin (MULTIVITAMIN) capsule, Take 1 capsule by mouth daily., Disp: , Rfl:    Omega-3 1000 MG CAPS, Take 1 tablet by mouth daily., Disp: , Rfl:    simvastatin (ZOCOR) 40 MG tablet, TAKE 1 TABLET BY MOUTH EVERYDAY AT BEDTIME, Disp: 90 tablet, Rfl: 0   valsartan (DIOVAN) 80 MG tablet, Take 1 tablet (80 mg total) by mouth daily., Disp: 90 tablet, Rfl: 0   ASSESSMENT AND PLAN: .      ICD-10-CM   1. Primary hypertension  I10 EKG 12-Lead    2. Mitral valve prolapse  I34.1     3. Pure hypercholesterolemia  E78.00      Assessment and Plan    Mitral Valve Prolapse Mild mitral valve prolapse with an audible click and soft murmur. No change over several years. No endocarditis prophylaxis needed per AHA/ACC guidelines. - No antibiotics for endocarditis prophylaxis - Follow up if experiencing dyspnea, palpitations, or if a significant murmur is detected by another physician  Hypertension Well-controlled on atenolol 50 mg and  valsartan. Blood pressure stable. - Continue atenolol 50 mg - Continue valsartan  Hyperlipidemia Last LDL was 103 mg/dL a year and a half ago. Goal is LDL <100 mg/dL. Currently on simvastatin. Discussed potential medication changes if LDL not at goal. - Follow up with Dr. Rana Snare for cholesterol labs - Consider switching to Crestor 20 mg daily or adding Zetia 10 mg daily if  LDL not <100 mg/dL  Obesity BMI is 39. Interested in weight loss for upcoming weddings. Discussed weight loss strategies including lifestyle modifications, medications, and potential referral to a weight loss clinic. Discussed Wellbutrin or naloxone for appetite suppression, noting potential side effects like nausea and loss of appetite. Emphasized long-term success requires lifestyle changes. - Recommend Weight Watchers for lifestyle modification and support - Consider referral to [facility] - Discuss potential use of Wellbutrin or naloxone for appetite suppression if needed  General Health Maintenance Behind on routine health maintenance. Regular gynecologist visits maintained. Encouraged to follow up with Dr. Rana Snare for routine labs including cholesterol. - Follow up with Dr. Rana Snare for routine labs including cholesterol - Encourage regular physical activity such as walking  Follow-up - Follow up with Dr. Rana Snare for routine labs and management - Return to cardiology if experiencing dyspnea, palpitations, or if a significant murmur is detected.    Signed,  Yates Decamp, MD, [facility] 09/19/2023, 4:21 PM [facility] [address] [place] [address] [place], [place] [address] Phone: [phone]. Fax:  [phone]

## 2023-09-19 NOTE — Patient Instructions (Signed)
Medication Instructions:  Your physician recommends that you continue on your current medications as directed. Please refer to the Current Medication list given to you today.  *If you need a refill on your cardiac medications before your next appointment, please call your pharmacy*   Lab Work: none If you have labs (blood work) drawn today and your tests are completely normal, you will receive your results only by: MyChart Message (if you have MyChart) OR A paper copy in the mail If you have any lab test that is abnormal or we need to change your treatment, we will call you to review the results.   Testing/Procedures: none   Follow-Up: At [facility], you and your health needs are our priority.  As part of our continuing mission to provide you with exceptional heart care, we have created designated Provider Care Teams.  These Care Teams include your primary Cardiologist (physician) and Advanced Practice Providers (APPs -  Physician Assistants and Nurse Practitioners) who all work together to provide you with the care you need, when you need it.  We recommend signing up for the patient portal called "MyChart".  Sign up information is provided on this After Visit Summary.  MyChart is used to connect with patients for Virtual Visits (Telemedicine).  Patients are able to view lab/test results, encounter notes, upcoming appointments, etc.  Non-urgent messages can be sent to your provider as well.   To learn more about what you can do with MyChart, go to [email or web address].    Your next appointment:   As needed  Provider:   Dr Jacinto Halim    Other Instructions

## 2023-10-23 ENCOUNTER — Other Ambulatory Visit: Payer: Self-pay | Admitting: Cardiology

## 2023-10-23 DIAGNOSIS — R002 Palpitations: Secondary | ICD-10-CM

## 2024-05-16 ENCOUNTER — Encounter: Payer: Self-pay | Admitting: Cardiology

## 2024-05-20 NOTE — Telephone Encounter (Signed)
 Please place a referral for healthy weight loss clinic, Indication: Morbid obesity.

## 2024-06-27 NOTE — Progress Notes (Signed)
 Medical Nutrition Therapy  Appointment Start time:  [phone]  Appointment End time:  [phone]  Primary concerns today: reduce weight to a lower BMI range Referral diagnosis: Morbid obesity  Preferred learning style:  no preference indicated Learning readiness:  change in progress  NUTRITION ASSESSMENT   Clinical Medical Hx:  Past Medical History:  Diagnosis Date   Mitral valve prolapse    Wolff-Parkinson-White syndrome     Medications: Atenolol  50mg , valsartin 80mg , simvastatin  40mg  Labs:  Lab Results  Component Value Date   CHOL 165 05/06/2022   HDL 48 05/06/2022   LDLCALC 103 (H) 05/06/2022   TRIG 74 05/06/2022   BP Readings from Last 3 Encounters:  09/19/23 135/72  05/13/22 128/73  05/14/21 115/75    Lifestyle & Dietary Hx Pt presents today alone. Pt desires weight reductions to a lower BMI range. Pt reports she is starting her journey to better health. Pt reports she does the cooking and shopping for a family of three. Pt reports eating out for dinner about 6-7 times weekly. Pt reports she is working full time mostly sitting. Pt reports her appetite is controlled. Pt reports she typical packs a lunch for work. Pt reports daily intake of dr pepper soda. Pt reports she can tolerate milk yet selects yogurt and cheese for personal preferences. Pt reports time and knowing what to eat are her biggest challenges currently.  Pt reports she is care giving to her mother in law.  All Pt's questions were answered during this encounter    Estimated daily fluid intake: 50 oz Supplements: none Sleep: good  Stress / self-care: 6-7 out of 10 / self care includes: time alone, go outside Current average weekly physical activity: walk the dogs daily for 10-15 minutes   24-Hr Dietary Recall First Meal: 1 pack of flavored oatmeal, water  Snack: nuts or jerky or flavored greek yogurt Second Meal: leftover chili beans or brunswick stew, baked chips, water Snack: none or nuts or trial mix or cheese  stick  Third Meal: chic fa la grilled market salad with or mythos soup or stamey's brunswick stew or 2 slices of meat lovers pizza or chinese or hibachi chicken,  dr pepper  Snack: none Beverages: water, 12 oz dr pepper  NUTRITION DIAGNOSIS  NB-1.1 Food and nutrition-related knowledge deficit As related to no prior nutrition related education.  As evidenced by Pt reports and dietary recall .   NUTRITION INTERVENTION  Nutrition education (E-1) on the following topics:  Fruits & Vegetables: Aim to fill half your plate with a variety of fruits and vegetables. They are rich in vitamins, minerals, and fiber, and can help reduce the risk of chronic diseases. Choose a colorful assortment of fruits and vegetables to ensure you get a wide range of nutrients. Grains and Starches: Make at least half of your grain choices whole grains, such as brown rice, whole wheat bread, and oats. Whole grains provide fiber, which aids in digestion and healthy cholesterol levels. Aim for whole forms of starchy vegetables such as potatoes, sweet potatoes, beans, peas, and corn, which are fiber rich and provide many vitamins and minerals.  Protein: Incorporate lean sources of protein, such as poultry, fish, beans, nuts, and seeds, into your meals. Protein is essential for building and repairing tissues, staying full, balancing blood sugar, as well as supporting immune function. Dairy: Include low-fat or fat-free dairy products like milk, yogurt, and cheese in your diet. Dairy foods are excellent sources of calcium  and vitamin D, which are  crucial for bone health.  Physical Activity: Aim for 60 minutes of physical activity daily. Regular physical activity promotes overall health-including helping to reduce risk for heart disease and diabetes, promoting mental health, and helping us  sleep better.   Handouts Provided Include  Plate Planner-[employer] Move Your way-DHHS Heart Healthy Label reading Tips  Learning Style & Readiness  for Change Teaching method utilized: Visual & Auditory  Demonstrated degree of understanding via: Teach Back  Barriers to learning/adherence to lifestyle change: time, care giving   Goals Established by Pt Aim to meal prep dinner 2-3 days weekly Increase water intake aiming for 64-90 ounces   MONITORING & EVALUATION Dietary intake, weekly physical activity, and weight  Next Steps  Patient is to return .

## 2024-07-05 ENCOUNTER — Encounter: Payer: Self-pay | Attending: Cardiology | Admitting: Dietician

## 2024-07-05 NOTE — Patient Instructions (Signed)
 Aim to meal prep dinner 2-3 days weekly Increase water intake aiming for 64-90 ounces

## 2024-07-29 ENCOUNTER — Other Ambulatory Visit: Payer: Self-pay | Admitting: Cardiology

## 2024-07-29 DIAGNOSIS — R002 Palpitations: Secondary | ICD-10-CM

## 2024-09-06 ENCOUNTER — Encounter: Admitting: Dietician
